# Patient Record
Sex: Female | Born: 1955 | Race: Black or African American | Hispanic: No | Marital: Married | Smoking: Never smoker
Health system: Southern US, Community
[De-identification: ages and names within clinical notes are randomized; demographics above are authoritative.]

## PROBLEM LIST (undated history)

## (undated) DIAGNOSIS — I10 Essential (primary) hypertension: Secondary | ICD-10-CM

## (undated) DIAGNOSIS — I671 Cerebral aneurysm, nonruptured: Secondary | ICD-10-CM

## (undated) HISTORY — DX: Cerebral aneurysm, nonruptured: I67.1

## (undated) HISTORY — PX: ABDOMINAL HYSTERECTOMY: SHX81

---

## 1984-08-01 HISTORY — PX: TUBAL LIGATION: SHX77

## 2002-07-01 HISTORY — PX: CEREBRAL ANEURYSM REPAIR: SHX164

## 2004-07-01 ENCOUNTER — Ambulatory Visit: Payer: Self-pay | Admitting: Internal Medicine

## 2004-07-19 ENCOUNTER — Ambulatory Visit: Payer: Self-pay | Admitting: Internal Medicine

## 2004-12-01 ENCOUNTER — Ambulatory Visit: Payer: Self-pay | Admitting: Internal Medicine

## 2005-08-09 ENCOUNTER — Ambulatory Visit: Payer: Self-pay | Admitting: Obstetrics and Gynecology

## 2006-08-31 ENCOUNTER — Ambulatory Visit: Payer: Self-pay | Admitting: Obstetrics and Gynecology

## 2006-09-08 ENCOUNTER — Ambulatory Visit: Payer: Self-pay | Admitting: Obstetrics and Gynecology

## 2007-01-02 ENCOUNTER — Ambulatory Visit: Payer: Self-pay | Admitting: Nurse Practitioner

## 2007-09-17 ENCOUNTER — Ambulatory Visit: Payer: Self-pay | Admitting: Family Medicine

## 2008-10-06 ENCOUNTER — Ambulatory Visit: Payer: Self-pay | Admitting: Obstetrics and Gynecology

## 2009-12-07 ENCOUNTER — Ambulatory Visit: Payer: Self-pay | Admitting: Obstetrics and Gynecology

## 2010-12-09 ENCOUNTER — Ambulatory Visit: Payer: Self-pay | Admitting: Obstetrics and Gynecology

## 2012-01-04 ENCOUNTER — Ambulatory Visit: Payer: Self-pay | Admitting: Obstetrics and Gynecology

## 2012-05-15 ENCOUNTER — Ambulatory Visit: Payer: Self-pay | Admitting: Gastroenterology

## 2013-01-04 ENCOUNTER — Ambulatory Visit: Payer: Self-pay | Admitting: Obstetrics and Gynecology

## 2014-02-19 ENCOUNTER — Ambulatory Visit: Payer: Self-pay | Admitting: Obstetrics and Gynecology

## 2014-06-19 DIAGNOSIS — I10 Essential (primary) hypertension: Secondary | ICD-10-CM | POA: Insufficient documentation

## 2015-03-24 ENCOUNTER — Other Ambulatory Visit: Payer: Self-pay | Admitting: Family Medicine

## 2015-03-24 DIAGNOSIS — Z1231 Encounter for screening mammogram for malignant neoplasm of breast: Secondary | ICD-10-CM

## 2015-03-31 ENCOUNTER — Ambulatory Visit
Admission: RE | Admit: 2015-03-31 | Discharge: 2015-03-31 | Disposition: A | Discharge status: Home / Self Care | Payer: BLUE CROSS/BLUE SHIELD | Source: Ambulatory Visit | Attending: Family Medicine | Admitting: Family Medicine

## 2015-03-31 DIAGNOSIS — Z1231 Encounter for screening mammogram for malignant neoplasm of breast: Secondary | ICD-10-CM

## 2015-08-17 ENCOUNTER — Other Ambulatory Visit: Payer: Self-pay | Admitting: Family Medicine

## 2015-08-17 DIAGNOSIS — R519 Headache, unspecified: Secondary | ICD-10-CM

## 2015-08-17 DIAGNOSIS — Z9889 Other specified postprocedural states: Secondary | ICD-10-CM

## 2015-08-17 DIAGNOSIS — Z8679 Personal history of other diseases of the circulatory system: Secondary | ICD-10-CM

## 2015-08-17 DIAGNOSIS — R51 Headache: Principal | ICD-10-CM

## 2015-08-21 ENCOUNTER — Ambulatory Visit: Admission: RE | Admit: 2015-08-21 | Payer: BLUE CROSS/BLUE SHIELD | Source: Ambulatory Visit

## 2015-08-24 ENCOUNTER — Other Ambulatory Visit: Payer: Self-pay | Admitting: Family Medicine

## 2015-08-24 DIAGNOSIS — Z8679 Personal history of other diseases of the circulatory system: Secondary | ICD-10-CM

## 2015-08-24 DIAGNOSIS — R51 Headache: Principal | ICD-10-CM

## 2015-08-24 DIAGNOSIS — Z9889 Other specified postprocedural states: Secondary | ICD-10-CM

## 2015-08-24 DIAGNOSIS — R519 Headache, unspecified: Secondary | ICD-10-CM

## 2015-09-01 ENCOUNTER — Ambulatory Visit: Payer: BLUE CROSS/BLUE SHIELD

## 2015-09-03 ENCOUNTER — Ambulatory Visit
Admission: RE | Admit: 2015-09-03 | Discharge: 2015-09-03 | Disposition: A | Discharge status: Home / Self Care | Payer: BLUE CROSS/BLUE SHIELD | Source: Ambulatory Visit | Attending: Family Medicine | Admitting: Family Medicine

## 2015-09-03 DIAGNOSIS — Z8679 Personal history of other diseases of the circulatory system: Secondary | ICD-10-CM

## 2015-09-03 DIAGNOSIS — Z9889 Other specified postprocedural states: Secondary | ICD-10-CM

## 2015-09-03 DIAGNOSIS — R519 Headache, unspecified: Secondary | ICD-10-CM

## 2015-09-03 DIAGNOSIS — R51 Headache: Secondary | ICD-10-CM | POA: Insufficient documentation

## 2015-09-03 HISTORY — DX: Essential (primary) hypertension: I10

## 2015-09-03 LAB — POCT I-STAT CREATININE: CREATININE: 0.9 mg/dL (ref 0.44–1.00)

## 2015-09-03 MED ORDER — IOHEXOL 350 MG/ML SOLN
80.0000 mL | Freq: Once | INTRAVENOUS | Status: AC | PRN
  Administered 2015-09-03: 80 mL via INTRAVENOUS

## 2016-02-11 ENCOUNTER — Ambulatory Visit (INDEPENDENT_AMBULATORY_CARE_PROVIDER_SITE_OTHER): Payer: BLUE CROSS/BLUE SHIELD | Admitting: Surgery

## 2016-02-11 ENCOUNTER — Encounter: Payer: Self-pay | Admitting: Surgery

## 2016-02-11 ENCOUNTER — Other Ambulatory Visit: Payer: Self-pay

## 2016-02-11 VITALS — BP 149/75 | HR 70 | Temp 98.5°F | Ht 67.0 in | Wt 259.0 lb

## 2016-02-11 DIAGNOSIS — L723 Sebaceous cyst: Secondary | ICD-10-CM

## 2016-02-11 DIAGNOSIS — L089 Local infection of the skin and subcutaneous tissue, unspecified: Secondary | ICD-10-CM

## 2016-02-11 MED ORDER — SULFAMETHOXAZOLE-TRIMETHOPRIM 800-160 MG PO TABS: 1.0000 | ORAL_TABLET | Freq: Two times a day (BID) | ORAL | Status: DC

## 2016-02-11 NOTE — Patient Instructions (Signed)
We have sent your antibiotic to your pharmacy. Please see your appointment listed below.

## 2016-02-12 ENCOUNTER — Encounter: Payer: Self-pay | Admitting: Surgery

## 2016-02-12 DIAGNOSIS — L723 Sebaceous cyst: Principal | ICD-10-CM

## 2016-02-12 DIAGNOSIS — L089 Local infection of the skin and subcutaneous tissue, unspecified: Secondary | ICD-10-CM | POA: Insufficient documentation

## 2016-02-12 NOTE — Progress Notes (Addendum)
Subjective:     Patient ID: Colleen Rogers, female   DOB: [birth year], 60 y.o.   MRN: [id number]  HPI  60 year old female who comes in with a complaint of a left shoulder cyst that has been present for 2 years. Patient states that about a week ago it became swollen and painful and red. Patient denies having any drainage from the area. Patient states this has never happened before. Patient denies any fever chills nausea vomiting increased redness. Patient does state that the pain is stabbing and constant in nature and does not move and is a 2 out of 10 pain. Patient saw her primary care physician on Monday and was referred to the surgery clinic. Patient does have a personal history of a brain aneurysm that was inner vascularly resected in 2004 at [facility] and well controlled hypertension but otherwise is healthy. Patient denies having any other lesions like this or any family history of other lesions like this. The patient denies any fever, chills, malaise, chest pain, shortness of breath, abdominal pain, nausea, vomiting, diarrhea, constipation or dysuria.  Past Medical History  Diagnosis Date  . Hypertension   . Cerebral aneurysm without rupture    Past Surgical History  Procedure Laterality Date  . Tubal ligation  1986  . Cerebral aneurysm repair  07/2002   Family History  Problem Relation Age of Onset  . Breast cancer Sister 38  . Diabetes Mother   . Kidney disease Mother   . Lung cancer Brother    Social History   Social History  . Marital Status: Married    Spouse Name: N/A  . Number of Children: N/A  . Years of Education: N/A   Social History Main Topics  . Smoking status: Never Smoker   . Smokeless tobacco: Never Used  . Alcohol Use: No  . Drug Use: No  . Sexual Activity: Not Asked   Other Topics Concern  . None   Social History Narrative    Current outpatient prescriptions:  .  amLODipine (NORVASC) 2.5 MG tablet, Take 2.5 mg by mouth daily., Disp: , Rfl: 1 .  aspirin  EC 81 MG tablet, Take by mouth., Disp: , Rfl:  .  hydrochlorothiazide (HYDRODIURIL) 25 MG tablet, TAKE 1 TABLET (25 MG TOTAL) BY MOUTH ONCE DAILY., Disp: , Rfl: 1 .  Multiple Vitamin (MULTI-VITAMINS) TABS, Take by mouth., Disp: , Rfl:  .  sulfamethoxazole-trimethoprim (BACTRIM DS,SEPTRA DS) 800-160 MG tablet, Take 1 tablet by mouth 2 (two) times daily., Disp: 20 tablet, Rfl: 0 Allergies  Allergen Reactions  . Clindamycin Nausea And Vomiting     Review of Systems  Constitutional: Negative for fever, chills, activity change, appetite change and fatigue.  HENT: Negative for congestion, sinus pressure and sore throat.   Respiratory: Negative for cough, shortness of breath and wheezing.   Cardiovascular: Negative for chest pain, palpitations and leg swelling.  Gastrointestinal: Negative for nausea, vomiting, abdominal pain, diarrhea and constipation.  Genitourinary: Negative for dysuria, hematuria and difficulty urinating.  Musculoskeletal: Negative for back pain and neck pain.  Skin: Positive for wound. Negative for color change, pallor and rash.  Neurological: Negative for dizziness, weakness and headaches.  Hematological: Negative for adenopathy. Does not bruise/bleed easily.  Psychiatric/Behavioral: Negative for agitation. The patient is not nervous/anxious.   All other systems reviewed and are negative.      Filed Vitals:   02/11/16 1441  BP: 149/75  Pulse: 70  Temp: 98.5 F (36.9 C)    Objective:  Physical Exam  Constitutional: She is oriented to person, place, and time. She appears well-developed and well-nourished. No distress.  HENT:  Head: Normocephalic and atraumatic.  Right Ear: External ear normal.  Left Ear: External ear normal.  Nose: Nose normal.  Mouth/Throat: No oropharyngeal exudate.  Eyes: Conjunctivae and EOM are normal. Pupils are equal, round, and reactive to light. No scleral icterus.  Neck: Normal range of motion. Neck supple. No tracheal deviation  present.  Cardiovascular: Normal rate, regular rhythm, normal heart sounds and intact distal pulses.  Exam reveals no gallop and no friction rub.   No murmur heard. Pulmonary/Chest: Effort normal and breath sounds normal. No respiratory distress. She has no wheezes. She has no rales.  Abdominal: Soft. Bowel sounds are normal. She exhibits no distension. There is no tenderness. There is no rebound.  Musculoskeletal: Normal range of motion. She exhibits no edema or tenderness.  Neurological: She is alert and oriented to person, place, and time. No cranial nerve deficit.  Skin: Skin is warm. No rash noted. There is erythema. No pallor.  Left posterior shoulder: 2cm sebaceous cyst with erythema and extreme tenderness, punctum evident but not open, no drainage.   Psychiatric: She has a normal mood and affect. Her behavior is normal. Judgment and thought content normal.  Vitals reviewed.      Assessment:     60 yr old female with infected sebaceous cyst      Plan:     I have personally reviewed her past medical history as noted above for a cerebral aneurysm that was surgically corrected in 2004 and well-controlled high blood pressure. Also reviewed the patient's laboratory values which were all within normal limits. The patient does not have any recent imaging. I have also personally reviewed the notes from her primary care physician noting the cyst on her left shoulder. I discussed with the patient the risks, benefits, alternatives and expected outcomes of incision and drainage of sebaceous cyst. I did explain to the patient that attempting to completely resect the area along with the cyst wall could be attempted however almost 100% of the time the area will come back if attempted resection during the inflamed period. I explained to the patient that the best course of action would be to either try a course of antibiotics and if this resolves the acute inflammation attempt resection in a couple weeks  or to perform an incision and drainage of the area today and in about 3 weeks fully resect the area. The patient would like to attempt to have this fully resected later and see if the antibiotics can resolve the acute infection. I did explain to the patient that occasionally the antibiotics will not be able to penetrate through the area and sometimes will not make a difference and lead to an incision and drainage being necessary. I also explained that it may decrease the inflammation but that the area may begin to drain. I also explained to the patient that a resection would involve risk of bleeding, infection, recurrence of the area and potential damage to structures surrounding the area however on her left shoulder is just some subcutaneous tissues. The patient was given opportunity to ask questions and have them answered. She would like to attempt a trial antibiotics and Bactrim DS was sent to her pharmacy. We will have her return in 3 weeks for resection of this area. The patient was instructed that if the area were to continue to stay red tender and swollen and not  improve or if she were to begin to have a fever and chills are feel that the redness was spreading that she should give [place] a call early next week so we can get her in for incision and drainage at that time.

## 2016-03-02 ENCOUNTER — Ambulatory Visit: Payer: BLUE CROSS/BLUE SHIELD | Admitting: Surgery

## 2016-03-14 ENCOUNTER — Ambulatory Visit (INDEPENDENT_AMBULATORY_CARE_PROVIDER_SITE_OTHER): Payer: BLUE CROSS/BLUE SHIELD | Admitting: Surgery

## 2016-03-14 ENCOUNTER — Ambulatory Visit: Payer: BLUE CROSS/BLUE SHIELD | Admitting: Surgery

## 2016-03-14 ENCOUNTER — Encounter: Payer: Self-pay | Admitting: Surgery

## 2016-03-14 DIAGNOSIS — L723 Sebaceous cyst: Secondary | ICD-10-CM | POA: Diagnosis not present

## 2016-03-14 DIAGNOSIS — L089 Local infection of the skin and subcutaneous tissue, unspecified: Secondary | ICD-10-CM

## 2016-03-14 MED ORDER — SULFAMETHOXAZOLE-TRIMETHOPRIM 800-160 MG PO TABS: 1.0000 | ORAL_TABLET | Freq: Two times a day (BID) | ORAL | 0 refills | Status: DC

## 2016-03-14 NOTE — Progress Notes (Signed)
Sebaceous Cyst Excision Procedure Note  Pre-operative Diagnosis: Epidermal cyst 3cm of left posterior shoulder Post-operative Diagnosis: same  Locations:left posterior shoulder  Indications: 60 year old female with a infection sebaceous cyst on left posterior shoulder she was seen a week earlier and started on Bactrim to decrease the redness and swelling. Patient states that she was slightly better however still having some pain and tenderness in the area.  Anesthesia: Lidocaine 1% without epinephrine without added sodium bicarbonate  Procedure Details  History of allergy to iodine: no  Patient informed of the risks (including bleeding and infection) and benefits of the  procedure and Written informed consent obtained.  The lesion and surrounding area was given a sterile prep using betadyne and draped in the usual sterile fashion. An incision was made over the cyst, which was dissected free of the surrounding tissue and removed.  The cyst was filled with typical sebaceous material.  The wound was closed with 3-0 Nylon using interrupted mattress stitches. Antibiotic ointment and a sterile dressing applied.  The specimen was not sent for pathologic examination. The patient tolerated the procedure well.  EBL: minimal  Findings: Infected cavity with grade necrotic material removed  Condition: Stable  Complications: none.  Plan: 1. Instructed to keep the wound dry and covered for 24-48h and clean thereafter. 2. Warning signs of infection were reviewed.   3. Recommended that the patient use OTC acetaminophen as needed for pain.  4. Return for suture removal in 1 week.

## 2016-03-14 NOTE — Patient Instructions (Signed)
Please do not remove your dressing for the next 48 hours. Then you are able to remove it. Please apply warm compress to your area. Take I buprofen if you have any pain on your left shoulder. We will see you back in a week to make sure everything is doing great and to remove your stitches. Please start taking your antibiotics and finish them all. This prescription was sent to your pharmacy.

## 2016-03-21 ENCOUNTER — Encounter: Payer: Self-pay | Admitting: General Surgery

## 2016-03-21 ENCOUNTER — Ambulatory Visit (INDEPENDENT_AMBULATORY_CARE_PROVIDER_SITE_OTHER): Payer: BLUE CROSS/BLUE SHIELD | Admitting: General Surgery

## 2016-03-21 VITALS — BP 146/91 | HR 76 | Temp 98.4°F | Ht 67.0 in | Wt 265.0 lb

## 2016-03-21 DIAGNOSIS — Z4889 Encounter for other specified surgical aftercare: Secondary | ICD-10-CM

## 2016-03-21 NOTE — Progress Notes (Signed)
Outpatient Surgical Follow Up  03/21/2016  Colleen Rogers is an 60 y.o. female.   Chief Complaint  Patient presents with  . Follow-up    I & D for cyst on left shoulder 03/14/2016 Dr. Samul Dada removal    HPI: 60 year old female returns to clinic 1 week after an I&D of the cyst of the left shoulder. Patient reports doing well. She denies any fevers, chills, nausea, vomiting, chest pain, short of breath, diarrhea, constipation. She's not had any pain or drainage from the surgical site of her left shoulder.  Past Medical History:  Diagnosis Date  . Cerebral aneurysm without rupture   . Hypertension     Past Surgical History:  Procedure Laterality Date  . CEREBRAL ANEURYSM REPAIR  07/2002  . TUBAL LIGATION  1986    Family History  Problem Relation Age of Onset  . Breast cancer Sister 39  . Diabetes Mother   . Kidney disease Mother   . Lung cancer Brother     Social History:  reports that she has never smoked. She has never used smokeless tobacco. She reports that she does not drink alcohol or use drugs.  Allergies:  Allergies  Allergen Reactions  . Clindamycin Nausea And Vomiting    Medications reviewed.    ROS A multipoint review of systems was completed. All pertinent positives and negatives are documented within the history of present illness and remainder are negative.   BP (!) 146/91 (BP Location: Right Arm, Patient Position: Sitting, Cuff Size: Normal)   Pulse 76   Temp 98.4 F (36.9 C) (Oral)   Ht 5\' 7"  (1.702 m)   Wt 120.2 kg (265 lb)   BMI 41.50 kg/m   Physical Exam Gen.: No acute distress Chest: Clear to auscultation Heart: Regular rhythm Abdomen: Soft and nontender Skin: Previous excision site in the left shoulder well approximated with 2 interrupted sutures. No evidence of erythema or drainage.    No results found for this or any previous visit (from the past 48 hour(s)). No results found.  Assessment/Plan:  1. Aftercare  following surgery 60 year old female status post left shoulder I&D. Sutures removed without difficulty. Replaced with Steri-Strips. Discussed signs and symptoms of infection with the patient who voiced understanding. She'll follow-up in clinic on an as-needed basis.     Clayburn Pert, MD FACS General Surgeon  03/21/2016,11:04 AM

## 2016-03-21 NOTE — Patient Instructions (Signed)
Please call our office if you have any questions or concerns.  

## 2016-03-28 ENCOUNTER — Other Ambulatory Visit: Payer: Self-pay | Admitting: Obstetrics and Gynecology

## 2016-03-28 DIAGNOSIS — Z1231 Encounter for screening mammogram for malignant neoplasm of breast: Secondary | ICD-10-CM

## 2016-04-11 ENCOUNTER — Ambulatory Visit
Admission: RE | Admit: 2016-04-11 | Discharge: 2016-04-11 | Disposition: A | Discharge status: Home / Self Care | Payer: BLUE CROSS/BLUE SHIELD | Source: Ambulatory Visit | Attending: Obstetrics and Gynecology | Admitting: Obstetrics and Gynecology

## 2016-04-11 ENCOUNTER — Other Ambulatory Visit: Payer: Self-pay | Admitting: Obstetrics and Gynecology

## 2016-04-11 DIAGNOSIS — Z1231 Encounter for screening mammogram for malignant neoplasm of breast: Secondary | ICD-10-CM | POA: Insufficient documentation

## 2016-07-15 ENCOUNTER — Other Ambulatory Visit: Payer: Self-pay | Admitting: Family Medicine

## 2016-07-15 ENCOUNTER — Ambulatory Visit: Admission: RE | Admit: 2016-07-15 | Payer: BLUE CROSS/BLUE SHIELD | Source: Ambulatory Visit

## 2016-07-15 DIAGNOSIS — M7989 Other specified soft tissue disorders: Secondary | ICD-10-CM

## 2016-07-18 ENCOUNTER — Ambulatory Visit
Admission: RE | Admit: 2016-07-18 | Discharge: 2016-07-18 | Disposition: A | Discharge status: Home / Self Care | Payer: BLUE CROSS/BLUE SHIELD | Source: Ambulatory Visit | Attending: Family Medicine | Admitting: Family Medicine

## 2016-07-18 DIAGNOSIS — I8391 Asymptomatic varicose veins of right lower extremity: Secondary | ICD-10-CM | POA: Diagnosis not present

## 2016-07-18 DIAGNOSIS — M7989 Other specified soft tissue disorders: Secondary | ICD-10-CM | POA: Diagnosis present

## 2016-08-23 ENCOUNTER — Encounter (INDEPENDENT_AMBULATORY_CARE_PROVIDER_SITE_OTHER): Payer: BLUE CROSS/BLUE SHIELD | Admitting: Vascular Surgery

## 2016-08-23 ENCOUNTER — Encounter (INDEPENDENT_AMBULATORY_CARE_PROVIDER_SITE_OTHER): Payer: Self-pay | Admitting: Vascular Surgery

## 2016-08-23 ENCOUNTER — Ambulatory Visit (INDEPENDENT_AMBULATORY_CARE_PROVIDER_SITE_OTHER): Payer: BLUE CROSS/BLUE SHIELD | Admitting: Vascular Surgery

## 2016-08-23 VITALS — BP 144/90 | HR 66 | Resp 16 | Ht 67.0 in | Wt 268.0 lb

## 2016-08-23 DIAGNOSIS — M7989 Other specified soft tissue disorders: Secondary | ICD-10-CM

## 2016-08-23 DIAGNOSIS — Z9889 Other specified postprocedural states: Secondary | ICD-10-CM

## 2016-08-23 DIAGNOSIS — M79604 Pain in right leg: Secondary | ICD-10-CM | POA: Diagnosis not present

## 2016-08-23 DIAGNOSIS — I10 Essential (primary) hypertension: Secondary | ICD-10-CM

## 2016-08-23 DIAGNOSIS — M79609 Pain in unspecified limb: Secondary | ICD-10-CM | POA: Insufficient documentation

## 2016-08-23 DIAGNOSIS — I83811 Varicose veins of right lower extremities with pain: Secondary | ICD-10-CM | POA: Diagnosis not present

## 2016-08-23 NOTE — Assessment & Plan Note (Signed)
blood pressure control important in reducing the progression of atherosclerotic disease. On appropriate oral medications.  

## 2016-08-23 NOTE — Assessment & Plan Note (Signed)
Sounds consistent with venous insufficiency. See workup and plan as below.

## 2016-08-23 NOTE — Assessment & Plan Note (Signed)
Sounds consistent with venous insufficiency. See workup and plan as below

## 2016-08-23 NOTE — Assessment & Plan Note (Signed)
See plan below

## 2016-08-23 NOTE — Progress Notes (Signed)
Patient ID: Colleen Rogers, female   DOB: 1955/08/13, 61 y.o.   MRN: [id number]  Chief Complaint  Patient presents with  . New Evaluation    Right leg pain    HPI Colleen Rogers is a 61 y.o. female.  I am asked to see the patient by Dr. Kandice Robinsons for evaluation of pain, swelling and varicosities of the lower extremities.  The patient presents with complaints of symptomatic varicosities of the legs. The patient reports a long standing history of varicosities and they have become painful over time. 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  The right leg is more severly affected. The patient elevates the legs for relief. The pain is described as aching and heaviness particularly in the lower legs. The symptoms are generally most severe in the evening, particularly when they have been on their feet for long periods of time. Elevation has been used to try to improve the symptoms with limited success. The patient complains of significant swelling as an associated symptom. The swelling affects both legs, but the right is worse. She denies fever, chills, chest pain or shortness of breath.    Past Medical History:  Diagnosis Date  . Cerebral aneurysm without rupture   . Hypertension     Past Surgical History:  Procedure Laterality Date  . CEREBRAL ANEURYSM REPAIR  07/2002  . TUBAL LIGATION  1986    Family History  Problem Relation Age of Onset  . Breast cancer Sister 66  . Diabetes Mother   . Kidney disease Mother   . Lung cancer Brother   No bleeding or clotting disorders  Social History Social History  Substance Use Topics  . Smoking status: Never Smoker  . Smokeless tobacco: Never Used  . Alcohol use No  Married No IVDU  Allergies  Allergen Reactions  . Clindamycin Nausea And Vomiting    Current Outpatient Prescriptions  Medication Sig Dispense  Refill  . amLODipine (NORVASC) 2.5 MG tablet Take 2.5 mg by mouth daily.  1  . aspirin EC 81 MG tablet Take by mouth.    . hydrochlorothiazide (HYDRODIURIL) 25 MG tablet TAKE 1 TABLET (25 MG TOTAL) BY MOUTH ONCE DAILY.  1  . Multiple Vitamin (MULTI-VITAMINS) TABS Take by mouth.    . sulfamethoxazole-trimethoprim (BACTRIM DS,SEPTRA DS) 800-160 MG tablet Take 1 tablet by mouth 2 (two) times daily. (Patient not taking: Reported on 08/23/2016) 20 tablet 0   No current facility-administered medications for this visit.       REVIEW OF SYSTEMS (Negative unless checked)  Constitutional: [] Weight loss  [] Fever  [] Chills Cardiac: [] Chest pain   [] Chest pressure   [] Palpitations   [] Shortness of breath when laying flat   [] Shortness of breath at rest   [] Shortness of breath with exertion. Vascular:  [] Pain in legs with walking   [] Pain in legs at rest   [] Pain in legs when laying flat   [] Claudication   [] Pain in feet when walking  [] Pain in feet at rest  [] Pain in feet when laying flat   [x] History of DVT   [] Phlebitis   [x] Swelling in legs   [x] Varicose veins   [] Non-healing ulcers Pulmonary:   [] Uses home oxygen   [] Productive cough   [] Hemoptysis   [] Wheeze  [] COPD   [] Asthma Neurologic:  [] Dizziness  [] Blackouts   [] Seizures   [x] History of stroke   []   History of TIA  [] Aphasia   [] Temporary blindness   [] Dysphagia   [] Weakness or numbness in arms   [] Weakness or numbness in legs Musculoskeletal:  [] Arthritis   [] Joint swelling   [] Joint pain   [] Low back pain Hematologic:  [] Easy bruising  [] Easy bleeding   [] Hypercoagulable state   [] Anemic  [] Hepatitis Gastrointestinal:  [] Blood in stool   [] Vomiting blood  [] Gastroesophageal reflux/heartburn   [] Abdominal pain Genitourinary:  [] Chronic kidney disease   [] Difficult urination  [] Frequent urination  [] Burning with urination   [] Hematuria Skin:  [] Rashes   [] Ulcers   [] Wounds Psychological:  [] History of anxiety   []  History of major  depression.    Physical Exam BP (!) 144/90 (BP Location: Right Arm)   Pulse 66   Resp 16   Ht 5\' 7"  (1.702 m)   Wt 268 lb (121.6 kg)   BMI 41.97 kg/m  Gen:  WD/WN, NAD Head: Craniotomy scar well healed, No temporalis wasting.  Ear/Nose/Throat: Hearing grossly intact, dentition good Eyes: Sclera non-icteric. Conjunctiva clear Neck: Supple, no nuchal rigidity. Trachea midline Pulmonary:  Good air movement, no use of accessory muscles, respirations not labored.  Cardiac: RRR, No JVD Vascular: Varicosities extensive and measuring up to 5-6 mm in the right lower extremity        Varicosities diffuse and measuring up to 3 mm in the left lower extremity Vessel Right Left  Radial Palpable Palpable  Ulnar Palpable Palpable  Brachial Palpable Palpable  Carotid Palpable, without bruit Palpable, without bruit  Aorta Not palpable N/A  Femoral Palpable Palpable  Popliteal Palpable Palpable  PT Palpable Palpable  DP Palpable Palpable   Gastrointestinal: soft, non-tender/non-distended. No guarding/reflex. No masses, surgical incisions, or scars. Musculoskeletal: M/S 5/5 throughout.   2 + RLE edema.  1 + LLE edema Neurologic: Sensation grossly intact in extremities.  Symmetrical.  Speech is fluent.  Psychiatric: Judgment intact, Mood & affect appropriate for pt's clinical situation. Dermatologic: No rashes or ulcers noted.  No cellulitis or open wounds. Lymph : No Cervical, Axillary, or Inguinal lymphadenopathy.   Radiology No results found.  Labs No results found for this or any previous visit (from the past 2160 hour(s)).  Assessment/Plan:  History of surgery for cerebral aneurysm Status post previous surgery. There is a reported history of DVT following this and some of her symptoms could certainly be postphlebitic in nature, although she does not recall having the DVT. She seems to be doing well without obvious residual deficits that are significant.  BP (high blood  pressure) blood pressure control important in reducing the progression of atherosclerotic disease. On appropriate oral medications.   Pain in limb Sounds consistent with venous insufficiency. See workup and plan as below.  Swelling of limb Sounds consistent with venous insufficiency. See workup and plan as below  Varicose veins of right lower extremity with pain See plan below    The patient has symptoms consistent with chronic venous insufficiency. We discussed the natural history and treatment options for venous disease. I recommended the regular use of 20 - 30 mm Hg compression stockings, and prescribed these today. I recommended leg elevation and anti-inflammatories as needed for pain. I have also recommended a complete venous duplex to assess the venous system for reflux or thrombotic issues. This can be done at the patient's convenience. I will see the patient back in 3 months to assess the response to conservative management, and determine further treatment options.     Leotis Pain 08/23/2016, 10:58 AM  This note was created with Dragon medical transcription system.  Any errors from dictation are unintentional.

## 2016-08-23 NOTE — Assessment & Plan Note (Signed)
Status post previous surgery. There is a reported history of DVT following this and some of her symptoms could certainly be postphlebitic in nature, although she does not recall having the DVT. She seems to be doing well without obvious residual deficits that are significant.

## 2016-08-23 NOTE — Patient Instructions (Signed)
Varicose Veins Varicose veins are veins that have become enlarged and twisted. They are usually seen in the legs but can occur in other parts of the body as well. CAUSES This condition is the result of valves in the veins not working properly. Valves in the veins help to return blood from the leg to the heart. If these valves are damaged, blood flows backward and backs up into the veins in the leg near the skin. This causes the veins to become larger. RISK FACTORS People who are on their feet a lot, who are pregnant, or who are overweight are more likely to develop varicose veins. SIGNS AND SYMPTOMS  Bulging, twisted-appearing, bluish veins, most commonly found on the legs.  Leg pain or a feeling of heaviness. These symptoms may be worse at the end of the day.  Leg swelling.  Changes in skin color. DIAGNOSIS A health care provider can usually diagnose varicose veins by examining your legs. Your health care provider may also recommend an ultrasound of your leg veins. TREATMENT Most varicose veins can be treated at home.However, other treatments are available for people who have persistent symptoms or want to improve the cosmetic appearance of the varicose veins. These treatment options include:  Sclerotherapy. A solution is injected into the vein to close it off.  Laser treatment. A laser is used to heat the vein to close it off.  Radiofrequency vein ablation. An electrical current produced by radio waves is used to close off the vein.  Phlebectomy. The vein is surgically removed through small incisions made over the varicose vein.  Vein ligation and stripping. The vein is surgically removed through incisions made over the varicose vein after the vein has been tied (ligated). HOME CARE INSTRUCTIONS   Do not stand or sit in one position for long periods of time. Do not sit with your legs crossed. Rest with your legs raised during the day.  Wear compression stockings as directed by  your health care provider. These stockings help to prevent blood clots and reduce swelling in your legs.  Do not wear other tight, encircling garments around your legs, pelvis, or waist.  Walk as much as possible to increase blood flow.  Raise the foot of your bed at night with 2-inch blocks.  If you get a cut in the skin over the vein and the vein bleeds, lie down with your leg raised and press on it with a clean cloth until the bleeding stops. Then place a bandage (dressing) on the cut. See your health care provider if it continues to bleed. SEEK MEDICAL CARE IF:  The skin around your ankle starts to break down.  You have pain, redness, tenderness, or hard swelling in your leg over a vein.  You are uncomfortable because of leg pain. This information is not intended to replace advice given to you by your health care provider. Make sure you discuss any questions you have with your health care provider. Document Released: 04/27/2005 Document Revised: 11/09/2015 Document Reviewed: 12/03/2013 [employer] Interactive Patient Education  2017 [employer]  

## 2016-11-25 ENCOUNTER — Encounter (INDEPENDENT_AMBULATORY_CARE_PROVIDER_SITE_OTHER): Payer: BLUE CROSS/BLUE SHIELD

## 2016-11-25 ENCOUNTER — Ambulatory Visit (INDEPENDENT_AMBULATORY_CARE_PROVIDER_SITE_OTHER): Payer: BLUE CROSS/BLUE SHIELD | Admitting: Vascular Surgery

## 2017-05-03 ENCOUNTER — Other Ambulatory Visit: Payer: Self-pay | Admitting: Obstetrics and Gynecology

## 2017-05-03 DIAGNOSIS — Z1231 Encounter for screening mammogram for malignant neoplasm of breast: Secondary | ICD-10-CM

## 2017-05-29 ENCOUNTER — Ambulatory Visit
Admission: RE | Admit: 2017-05-29 | Discharge: 2017-05-29 | Disposition: A | Discharge status: Home / Self Care | Payer: BLUE CROSS/BLUE SHIELD | Source: Ambulatory Visit | Attending: Obstetrics and Gynecology | Admitting: Obstetrics and Gynecology

## 2017-05-29 DIAGNOSIS — Z1231 Encounter for screening mammogram for malignant neoplasm of breast: Secondary | ICD-10-CM | POA: Diagnosis present

## 2018-06-20 ENCOUNTER — Other Ambulatory Visit: Payer: Self-pay | Admitting: Obstetrics and Gynecology

## 2018-06-20 DIAGNOSIS — Z1231 Encounter for screening mammogram for malignant neoplasm of breast: Secondary | ICD-10-CM

## 2018-07-04 ENCOUNTER — Ambulatory Visit
Admission: RE | Admit: 2018-07-04 | Discharge: 2018-07-04 | Disposition: A | Discharge status: Home / Self Care | Payer: Managed Care, Other (non HMO) | Source: Ambulatory Visit | Attending: Obstetrics and Gynecology | Admitting: Obstetrics and Gynecology

## 2018-07-04 ENCOUNTER — Encounter (INDEPENDENT_AMBULATORY_CARE_PROVIDER_SITE_OTHER): Payer: Self-pay

## 2018-07-04 DIAGNOSIS — Z1231 Encounter for screening mammogram for malignant neoplasm of breast: Secondary | ICD-10-CM | POA: Insufficient documentation

## 2018-10-15 ENCOUNTER — Encounter (INDEPENDENT_AMBULATORY_CARE_PROVIDER_SITE_OTHER): Payer: Self-pay | Admitting: Nurse Practitioner

## 2018-10-15 ENCOUNTER — Ambulatory Visit (INDEPENDENT_AMBULATORY_CARE_PROVIDER_SITE_OTHER): Payer: Managed Care, Other (non HMO) | Admitting: Nurse Practitioner

## 2018-10-15 ENCOUNTER — Other Ambulatory Visit: Payer: Self-pay

## 2018-10-15 VITALS — BP 147/88 | HR 66 | Resp 19 | Ht 67.0 in | Wt 248.0 lb

## 2018-10-15 DIAGNOSIS — I10 Essential (primary) hypertension: Secondary | ICD-10-CM

## 2018-10-15 DIAGNOSIS — R600 Localized edema: Secondary | ICD-10-CM | POA: Diagnosis not present

## 2018-10-15 DIAGNOSIS — Z79899 Other long term (current) drug therapy: Secondary | ICD-10-CM | POA: Diagnosis not present

## 2018-10-15 DIAGNOSIS — I83813 Varicose veins of bilateral lower extremities with pain: Secondary | ICD-10-CM

## 2018-10-15 DIAGNOSIS — M7989 Other specified soft tissue disorders: Secondary | ICD-10-CM

## 2018-10-15 NOTE — Progress Notes (Signed)
SUBJECTIVE:  Patient ID: Colleen Rogers, female    DOB: July 29, 1956, 63 y.o.   MRN: [id number] Chief Complaint  Patient presents with  . Follow-up    varicose veins    HPI  Colleen Rogers is a 63 y.o. female The patient returns for followup evaluation 2 years after the initial visit. The patient continues to have pain in the lower extremities with dependency.  She also has swelling in the bilateral lower extremities.  The pain and swelling is lessened with elevation.  Since her last visit she has been wearing graduated compression stockings, Class I (20-30 mmHg), have been worn but the stockings do not eliminate the leg pain. Over-the-counter analgesics do not improve the symptoms. The degree of discomfort continues to interfere with daily activities. The patient notes the pain in the legs is causing problems with daily exercise, at the workplace and even with household activities and maintenance such as standing in the kitchen preparing meals and doing dishes.    Past Medical History:  Diagnosis Date  . Cerebral aneurysm without rupture   . Hypertension     Past Surgical History:  Procedure Laterality Date  . CEREBRAL ANEURYSM REPAIR  07/2002  . TUBAL LIGATION  1986    Social History   Socioeconomic History  . Marital status: Married    Spouse name: Not on file  . Number of children: Not on file  . Years of education: Not on file  . Highest education level: Not on file  Occupational History  . Not on file  Social Needs  . Financial resource strain: Not on file  . Food insecurity:    Worry: Not on file    Inability: Not on file  . Transportation needs:    Medical: Not on file    Non-medical: Not on file  Tobacco Use  . Smoking status: Never Smoker  . Smokeless tobacco: Never Used  Substance and Sexual Activity  . Alcohol use: No  . Drug use: No  . Sexual activity: Not on file  Lifestyle  . Physical activity:    Days per week: Not on file    Minutes per  session: Not on file  . Stress: Not on file  Relationships  . Social connections:    Talks on phone: Not on file    Gets together: Not on file    Attends religious service: Not on file    Active member of club or organization: Not on file    Attends meetings of clubs or organizations: Not on file    Relationship status: Not on file  . Intimate partner violence:    Fear of current or ex partner: Not on file    Emotionally abused: Not on file    Physically abused: Not on file    Forced sexual activity: Not on file  Other Topics Concern  . Not on file  Social History Narrative  . Not on file    Family History  Problem Relation Age of Onset  . Breast cancer Sister 72  . Diabetes Mother   . Kidney disease Mother   . Lung cancer Brother     Allergies  Allergen Reactions  . Clindamycin Nausea And Vomiting     Review of Systems   Review of Systems: Negative Unless Checked Constitutional: [] Weight loss  [] Fever  [] Chills Cardiac: [] Chest pain   []  Atrial Fibrillation  [] Palpitations   [] Shortness of breath when laying flat   [] Shortness of breath with exertion. []   Shortness of breath at rest Vascular:  [] Pain in legs with walking   [x] Pain in legs with standing [] Pain in legs when laying flat   [] Claudication    [] Pain in feet when laying flat    [] History of DVT   [] Phlebitis   [x] Swelling in legs   [x] Varicose veins   [] Non-healing ulcers Pulmonary:   [] Uses home oxygen   [] Productive cough   [] Hemoptysis   [] Wheeze  [] COPD   [] Asthma Neurologic:  [] Dizziness   [] Seizures  [] Blackouts [] History of stroke   [] History of TIA  [] Aphasia   [] Temporary Blindness   [] Weakness or numbness in arm   [] Weakness or numbness in leg Musculoskeletal:   [] Joint swelling   [] Joint pain   [] Low back pain  []  History of Knee Replacement [] Arthritis [] back Surgeries  []  Spinal Stenosis    Hematologic:  [] Easy bruising  [] Easy bleeding   [] Hypercoagulable state   [] Anemic Gastrointestinal:   [] Diarrhea   [] Vomiting  [] Gastroesophageal reflux/heartburn   [] Difficulty swallowing. [] Abdominal pain Genitourinary:  [] Chronic kidney disease   [] Difficult urination  [] Anuric   [] Blood in urine [] Frequent urination  [] Burning with urination   [] Hematuria Skin:  [] Rashes   [] Ulcers [] Wounds Psychological:  [] History of anxiety   []  History of major depression  []  Memory Difficulties      OBJECTIVE:   Physical Exam  BP (!) 147/88 (BP Location: Right Arm)   Pulse 66   Resp 19   Ht 5\' 7"  (1.702 m)   Wt 248 lb (112.5 kg)   BMI 38.84 kg/m   Gen: WD/WN, NAD Head: [place]/AT, No temporalis wasting.  Ear/Nose/Throat: Hearing grossly intact, nares w/o erythema or drainage Eyes: PER, EOMI, sclera nonicteric.  Neck: Supple, no masses.  No JVD.  Pulmonary:  Good air movement, no use of accessory muscles.  Cardiac: RRR Vascular:  Scattered spider varicosities bilaterally.  Right lower extremity has large, ropelike varicosities from mid thigh to distal portion of calf.  Approximately 3 mm in diameter. Vessel Right Left  Radial Palpable Palpable  Dorsalis Pedis Palpable Palpable  Posterior Tibial Palpable Palpable   Gastrointestinal: soft, non-distended. No guarding/no peritoneal signs.  Musculoskeletal: M/S 5/5 throughout.  No deformity or atrophy.  Neurologic: Pain and light touch intact in extremities.  Symmetrical.  Speech is fluent. Motor exam as listed above. Psychiatric: Judgment intact, Mood & affect appropriate for pt's clinical situation. Dermatologic: No Venous rashes. No Ulcers Noted.  No changes consistent with cellulitis. Lymph : No Cervical lymphadenopathy, no lichenification or skin changes of chronic lymphedema.       ASSESSMENT AND PLAN:  1. Varicose veins of both lower extremities with pain  Recommend:  The patient has large symptomatic varicose veins that are painful and associated with swelling.  I have had a long discussion with the patient regarding  varicose  veins and why they cause symptoms.  Patient will continue graduated compression stockings class 1 on a daily basis, beginning first thing in the morning and removing them in the evening. The patient is instructed specifically not to sleep in the stockings.    The patient  will also begin using over-the-counter analgesics such as Motrin 600 mg po TID to help control the symptoms.    In addition, behavioral modification including elevation during the day will be continued      An  ultrasound of the venous system will be obtained.   Further plans will be based on the ultrasound results  - VAS [place] LOWER EXTREMITY  VENOUS REFLUX; Future  2. Essential hypertension Continue antihypertensive medications as already ordered, these medications have been reviewed and there are no changes at this time.   3. Swelling of limb See above    Current Outpatient Medications on File Prior to Visit  Medication Sig Dispense Refill  . amLODipine (NORVASC) 2.5 MG tablet Take 2.5 mg by mouth daily.  1  . aspirin EC 81 MG tablet Take by mouth.    . hydrochlorothiazide (HYDRODIURIL) 25 MG tablet TAKE 1 TABLET (25 MG TOTAL) BY MOUTH ONCE DAILY.  1  . Multiple Vitamin (MULTI-VITAMINS) TABS Take by mouth.    . sulfamethoxazole-trimethoprim (BACTRIM DS,SEPTRA DS) 800-160 MG tablet Take 1 tablet by mouth 2 (two) times daily. (Patient not taking: Reported on 08/23/2016) 20 tablet 0   No current facility-administered medications on file prior to visit.     There are no Patient Instructions on file for this visit. No follow-ups on file.   Kris Hartmann, NP  This note was completed with Sales executive.  Any errors are purely unintentional.

## 2018-10-16 ENCOUNTER — Encounter (INDEPENDENT_AMBULATORY_CARE_PROVIDER_SITE_OTHER): Payer: Self-pay | Admitting: Vascular Surgery

## 2018-10-16 ENCOUNTER — Ambulatory Visit (INDEPENDENT_AMBULATORY_CARE_PROVIDER_SITE_OTHER): Payer: Managed Care, Other (non HMO) | Admitting: Vascular Surgery

## 2018-10-16 ENCOUNTER — Ambulatory Visit (INDEPENDENT_AMBULATORY_CARE_PROVIDER_SITE_OTHER): Payer: Managed Care, Other (non HMO)

## 2018-10-16 VITALS — BP 138/85 | HR 56 | Resp 16 | Ht 67.0 in | Wt 248.0 lb

## 2018-10-16 DIAGNOSIS — I10 Essential (primary) hypertension: Secondary | ICD-10-CM

## 2018-10-16 DIAGNOSIS — Z9889 Other specified postprocedural states: Secondary | ICD-10-CM

## 2018-10-16 DIAGNOSIS — I83811 Varicose veins of right lower extremities with pain: Secondary | ICD-10-CM

## 2018-10-16 DIAGNOSIS — Z79899 Other long term (current) drug therapy: Secondary | ICD-10-CM | POA: Diagnosis not present

## 2018-10-16 DIAGNOSIS — I83813 Varicose veins of bilateral lower extremities with pain: Secondary | ICD-10-CM | POA: Diagnosis not present

## 2018-10-16 NOTE — Patient Instructions (Signed)
Nonsurgical Procedures for Varicose Veins, Care After  This sheet gives you information about how to care for yourself after your procedure. Your health care provider may also give you more specific instructions. If you have problems or questions, contact your health care provider.  What can I expect after the procedure?  After the procedure, it is common to have:  · Swelling.  · Bruising.  · Soreness.  · Mild skin discoloration.  · Slight bleeding at the incision sites.  Follow these instructions at home:  Incision or puncture site care  · Follow instructions from your health care provider about how to take care of your incision or puncture site. Make sure you:  ? Wash your hands with soap and water before you change your bandage (dressing). If soap and water are not available, use hand sanitizer.  ? Change your dressing as told by your health care provider.  ? Leave skin glue or adhesive strips in place. These skin closures may need to stay in place for 2 weeks or longer. If adhesive strip edges start to loosen and curl up, you may trim the loose edges. Do not remove adhesive strips completely unless your health care provider tells you to do that.  · Check your incision or puncture area every day for signs of infection. Check for:  ? Redness, swelling, or pain.  ? Fluid or blood.  ? Warmth.  ? Pus or a bad smell.  General instructions    · Take over-the-counter and prescription medicines only as told by your health care provider.  · Wear compression stockings as told by your health care provider. These stockings help to prevent blood clots and reduce swelling in your legs.  · Do not take baths, swim, or use a hot tub until your health care provider approves. Ask your health care provider if you can take showers.  · Wear loose-fitting clothing.  · Return to your normal activities as told by your health care provider. Ask your health care provider what activities are safe for you.  · Get regular daily exercise. Walk  or ride a stationary bike daily or as told by your health care provider.  · Keep all follow-up visits as told by your health care provider. This is important.  Contact a health care provider if:  · You have a fever.  · You have redness, swelling, or pain around your incision or puncture site.  · You have fluid or blood coming from your incision or puncture site.  · Your incision or puncture site feels warm to the touch.  · You have pus or a bad smell coming from your incision or puncture site.  · You develop a cough.  Get help right away if:  · You pass out.  · You have very bad pain in your leg.  · You have leg pain that gets worse when you walk.  · You have redness or swelling in your leg that is getting worse.  · You have trouble breathing.  · You cough up blood.  Summary  · After the procedure, it is common to have swelling, bruising, soreness, or mild skin discoloration.  · Follow instructions from your health care provider about how to take care of your incision or puncture site.  · Wear compression stockings as told by your health care provider. These stockings help to prevent blood clots and reduce swelling in your legs.  This information is not intended to replace advice given to you by 

## 2018-10-16 NOTE — Assessment & Plan Note (Signed)
Venous reflux study today demonstrates deep venous reflux bilaterally with significant right great saphenous vein reflux and left small saphenous vein reflux.  No DVT or superficial thrombophlebitis was seen. Conservative therapy have not adequately controlled her symptoms.  As such, she would benefit from right great saphenous vein laser ablation.  Risks and benefits were discussed with the patient.  The patient had the procedure described to them in detail.  She voices her understanding and is agreeable to proceed.  We also discussed foam sclerotherapy for the large prominent residual varicosities that would likely be left after laser ablation.

## 2018-10-16 NOTE — Progress Notes (Signed)
MRN : [id number]  Colleen Rogers is a 63 y.o. ([birth year]) female who presents with chief complaint of  Chief Complaint  Patient presents with  . Follow-up    ultrasound follow up  .  History of Present Illness: Patient returns today in follow up of her right leg pain and swelling.  Compression stockings and elevation had minimal improvement in terms of the pain and swelling in that right leg recently.  Her varicosities continue to get more prominent.  The left leg has some symptoms but not as severe as the right.  Venous reflux study today demonstrates deep venous reflux bilaterally with significant right great saphenous vein reflux and left small saphenous vein reflux.  No DVT or superficial thrombophlebitis was seen.  Current Outpatient Medications  Medication Sig Dispense Refill  . amLODipine (NORVASC) 2.5 MG tablet Take 2.5 mg by mouth daily.  1  . aspirin EC 81 MG tablet Take by mouth.    . hydrochlorothiazide (HYDRODIURIL) 25 MG tablet TAKE 1 TABLET (25 MG TOTAL) BY MOUTH ONCE DAILY.  1  . Multiple Vitamin (MULTI-VITAMINS) TABS Take by mouth.    . sulfamethoxazole-trimethoprim (BACTRIM DS,SEPTRA DS) 800-160 MG tablet Take 1 tablet by mouth 2 (two) times daily. (Patient not taking: Reported on 08/23/2016) 20 tablet 0   No current facility-administered medications for this visit.     Past Medical History:  Diagnosis Date  . Cerebral aneurysm without rupture   . Hypertension     Past Surgical History:  Procedure Laterality Date  . CEREBRAL ANEURYSM REPAIR  07/2002  . TUBAL LIGATION  1986    Social History Social History   Tobacco Use  . Smoking status: Never Smoker  . Smokeless tobacco: Never Used  Substance Use Topics  . Alcohol use: No  . Drug use: No     Family History Family History  Problem Relation Age of Onset  . Breast cancer Sister 28  . Diabetes Mother   . Kidney disease Mother   . Lung cancer Brother     Allergies  Allergen Reactions  .  Clindamycin Nausea And Vomiting    REVIEW OF SYSTEMS (Negative unless checked)  Constitutional: [] ?Weight loss  [] ?Fever  [] ?Chills Cardiac: [] ?Chest pain   [] ?Chest pressure   [] ?Palpitations   [] ?Shortness of breath when laying flat   [] ?Shortness of breath at rest   [] ?Shortness of breath with exertion. Vascular:  [] ?Pain in legs with walking   [] ?Pain in legs at rest   [] ?Pain in legs when laying flat   [] ?Claudication   [] ?Pain in feet when walking  [] ?Pain in feet at rest  [] ?Pain in feet when laying flat   [x] ?History of DVT   [] ?Phlebitis   [x] ?Swelling in legs   [x] ?Varicose veins   [] ?Non-healing ulcers Pulmonary:   [] ?Uses home oxygen   [] ?Productive cough   [] ?Hemoptysis   [] ?Wheeze  [] ?COPD   [] ?Asthma Neurologic:  [] ?Dizziness  [] ?Blackouts   [] ?Seizures   [x] ?History of stroke   [] ?History of TIA  [] ?Aphasia   [] ?Temporary blindness   [] ?Dysphagia   [] ?Weakness or numbness in arms   [] ?Weakness or numbness in legs Musculoskeletal:  [] ?Arthritis   [] ?Joint swelling   [] ?Joint pain   [] ?Low back pain Hematologic:  [] ?Easy bruising  [] ?Easy bleeding   [] ?Hypercoagulable state   [] ?Anemic  [] ?Hepatitis Gastrointestinal:  [] ?Blood in stool   [] ?Vomiting blood  [] ?Gastroesophageal reflux/heartburn   [] ?Abdominal pain Genitourinary:  [] ?Chronic kidney disease   [] ?Difficult urination  [] ?Frequent  urination  [] ?Burning with urination   [] ?Hematuria Skin:  [] ?Rashes   [] ?Ulcers   [] ?Wounds Psychological:  [] ?History of anxiety   [] ? History of major depression.    Physical Examination  BP 138/85 (BP Location: Right Arm)   Pulse (!) 56   Resp 16   Ht 5\' 7"  (1.702 m)   Wt 248 lb (112.5 kg)   BMI 38.84 kg/m  Gen:  WD/WN, NAD Head: /AT, No temporalis wasting. Ear/Nose/Throat: Hearing grossly intact, nares w/o erythema or drainage Eyes: Conjunctiva clear. Sclera non-icteric Neck: Supple.  Trachea midline Pulmonary:  Good air movement, no use of accessory muscles.  Cardiac:  RRR, no JVD Vascular: 3 to 4 mm varicosities present throughout the right lower leg.  Smaller varicosities present in the left lower leg. Vessel Right Left  Radial Palpable Palpable                          PT Palpable Palpable  DP Palpable Palpable    Musculoskeletal: M/S 5/5 throughout.  No deformity or atrophy.  1+ right lower extremity edema, trace left lower extremity edema. Neurologic: Sensation grossly intact in extremities.  Symmetrical.  Speech is fluent.  Psychiatric: Judgment intact, Mood & affect appropriate for pt's clinical situation. Dermatologic: No rashes or ulcers noted.  No cellulitis or open wounds.       Labs No results found for this or any previous visit (from the past 2160 hour(s)).  Radiology No results found.  Assessment/Plan History of surgery for cerebral aneurysm Status post previous surgery. There is a reported history of DVT following this and some of her symptoms could certainly be postphlebitic in nature, although she does not recall having the DVT. She seems to be doing well without obvious residual deficits that are significant.  BP (high blood pressure) blood pressure control important in reducing the progression of atherosclerotic disease. On appropriate oral medications.  Varicose veins of right lower extremity with pain Venous reflux study today demonstrates deep venous reflux bilaterally with significant right great saphenous vein reflux and left small saphenous vein reflux.  No DVT or superficial thrombophlebitis was seen. Conservative therapy have not adequately controlled her symptoms.  As such, she would benefit from right great saphenous vein laser ablation.  Risks and benefits were discussed with the patient.  The patient had the procedure described to them in detail.  She voices her understanding and is agreeable to proceed.  We also discussed foam sclerotherapy for the large prominent residual varicosities that would likely be  left after laser ablation.    Leotis Pain, MD  10/16/2018 12:59 PM    This note was created with Dragon medical transcription system.  Any errors from dictation are purely unintentional

## 2019-04-05 ENCOUNTER — Other Ambulatory Visit: Payer: Self-pay

## 2019-04-05 ENCOUNTER — Encounter (INDEPENDENT_AMBULATORY_CARE_PROVIDER_SITE_OTHER): Payer: Self-pay

## 2019-04-05 ENCOUNTER — Encounter (INDEPENDENT_AMBULATORY_CARE_PROVIDER_SITE_OTHER): Payer: Self-pay | Admitting: Vascular Surgery

## 2019-04-05 ENCOUNTER — Other Ambulatory Visit (INDEPENDENT_AMBULATORY_CARE_PROVIDER_SITE_OTHER): Payer: Self-pay | Admitting: Vascular Surgery

## 2019-04-05 ENCOUNTER — Ambulatory Visit (INDEPENDENT_AMBULATORY_CARE_PROVIDER_SITE_OTHER): Payer: 59 | Admitting: Vascular Surgery

## 2019-04-05 VITALS — BP 140/77 | HR 77 | Resp 16 | Ht 67.0 in | Wt 259.0 lb

## 2019-04-05 DIAGNOSIS — Z9889 Other specified postprocedural states: Secondary | ICD-10-CM

## 2019-04-05 DIAGNOSIS — I83811 Varicose veins of right lower extremities with pain: Secondary | ICD-10-CM

## 2019-04-05 NOTE — Progress Notes (Signed)
  Colleen Rogers is a 63 y.o. female who presents with symptomatic venous reflux  Past Medical History:  Diagnosis Date  . Cerebral aneurysm without rupture   . Hypertension     Past Surgical History:  Procedure Laterality Date  . CEREBRAL ANEURYSM REPAIR  07/2002  . TUBAL LIGATION  1986     Current Outpatient Medications:  .  amLODipine (NORVASC) 2.5 MG tablet, Take 2.5 mg by mouth daily., Disp: , Rfl: 1 .  aspirin EC 81 MG tablet, Take by mouth., Disp: , Rfl:  .  hydrochlorothiazide (HYDRODIURIL) 25 MG tablet, TAKE 1 TABLET (25 MG TOTAL) BY MOUTH ONCE DAILY., Disp: , Rfl: 1 .  Multiple Vitamin (MULTI-VITAMINS) TABS, Take by mouth., Disp: , Rfl:  .  sulfamethoxazole-trimethoprim (BACTRIM DS,SEPTRA DS) 800-160 MG tablet, Take 1 tablet by mouth 2 (two) times daily. (Patient not taking: Reported on 08/23/2016), Disp: 20 tablet, Rfl: 0  Allergies  Allergen Reactions  . Clindamycin Nausea And Vomiting     Varicose veins of right lower extremity with pain     PLAN: The patient's right lower extremity was sterilely prepped and draped. The ultrasound machine was used to visualize the saphenous vein throughout its course. A segment of the GSV around the level of the knee was selected for access. The saphenous vein was accessed without difficulty using ultrasound guidance with a micropuncture needle. A 0.018 wire was then placed beyond the saphenofemoral junction and the needle was removed. The 65 cm sheath was then placed over the wire and the wire and dilator were removed. The laser fiber was then placed through the sheath and its tip was placed approximately 4-5 centimeters below the saphenofemoral junction. Tumescent anesthesia was then created with a dilute lidocaine solution. Laser energy was then delivered with constant withdrawal of the sheath and laser fiber. Approximately 1250 joules of energy were delivered over a length of 33 centimeters using a 1470 Hz VenaCure machine at 7 W.  Sterile dressings were placed. The patient tolerated the procedure well without obvious complications.   Follow-up in 1 week with post-laser duplex.

## 2019-04-09 ENCOUNTER — Other Ambulatory Visit: Payer: Self-pay

## 2019-04-09 ENCOUNTER — Ambulatory Visit (INDEPENDENT_AMBULATORY_CARE_PROVIDER_SITE_OTHER): Payer: 59

## 2019-04-09 DIAGNOSIS — Z9889 Other specified postprocedural states: Secondary | ICD-10-CM

## 2019-05-03 ENCOUNTER — Other Ambulatory Visit: Payer: Self-pay

## 2019-05-03 ENCOUNTER — Encounter (INDEPENDENT_AMBULATORY_CARE_PROVIDER_SITE_OTHER): Payer: Self-pay | Admitting: Nurse Practitioner

## 2019-05-03 ENCOUNTER — Ambulatory Visit (INDEPENDENT_AMBULATORY_CARE_PROVIDER_SITE_OTHER): Payer: 59 | Admitting: Nurse Practitioner

## 2019-05-03 VITALS — BP 134/85 | HR 71 | Resp 19 | Ht 67.0 in | Wt 257.0 lb

## 2019-05-03 DIAGNOSIS — M7989 Other specified soft tissue disorders: Secondary | ICD-10-CM

## 2019-05-03 DIAGNOSIS — I83811 Varicose veins of right lower extremities with pain: Secondary | ICD-10-CM | POA: Diagnosis not present

## 2019-05-03 DIAGNOSIS — I10 Essential (primary) hypertension: Secondary | ICD-10-CM | POA: Diagnosis not present

## 2019-05-06 ENCOUNTER — Encounter (INDEPENDENT_AMBULATORY_CARE_PROVIDER_SITE_OTHER): Payer: Self-pay | Admitting: Nurse Practitioner

## 2019-05-06 NOTE — Progress Notes (Addendum)
SUBJECTIVE:  Patient ID: Colleen Rogers, female    DOB: [birth year], 63 y.o.   MRN: [id number] Chief Complaint  Patient presents with  . Follow-up    HPI  Colleen Rogers is a 63 y.o. female The patient returns to the office for followup status post laser ablation of the right great saphenous vein on 04/05/2019. The patient notes multiple residual varicosities bilaterally which continued to hurt with dependent positions and remained tender to palpation. The patient's swelling is unchanged from preoperative status. The patient continues to wear graduated compression stockings on a daily basis but these are not eliminating the pain and discomfort. The patient continues to use over-the-counter anti-inflammatory medications to treat the pain and related symptoms but this has not given the patient relief. The patient notes the pain in the lower extremities is causing problems with daily exercise, problems at work and even with household activities such as preparing meals and doing dishes.  The patient is otherwise done well and there have been no complications related to the laser procedure or interval changes in the patient's overall   Venous ultrasound post laser shows successful laser ablation of the right great saphenous vein, no DVT identified. Venous reflux study measures the patient's varicosities in the great saphenous vein to be greater than 5 mm throughout.    Past Medical History:  Diagnosis Date  . Cerebral aneurysm without rupture   . Hypertension     Past Surgical History:  Procedure Laterality Date  . CEREBRAL ANEURYSM REPAIR  07/2002  . TUBAL LIGATION  1986    Social History   Socioeconomic History  . Marital status: Married    Spouse name: Not on file  . Number of children: Not on file  . Years of education: Not on file  . Highest education level: Not on file  Occupational History  . Not on file  Social Needs  . Financial resource strain: Not on file  .  Food insecurity    Worry: Not on file    Inability: Not on file  . Transportation needs    Medical: Not on file    Non-medical: Not on file  Tobacco Use  . Smoking status: Never Smoker  . Smokeless tobacco: Never Used  Substance and Sexual Activity  . Alcohol use: No  . Drug use: No  . Sexual activity: Not on file  Lifestyle  . Physical activity    Days per week: Not on file    Minutes per session: Not on file  . Stress: Not on file  Relationships  . Social Herbalist on phone: Not on file    Gets together: Not on file    Attends religious service: Not on file    Active member of club or organization: Not on file    Attends meetings of clubs or organizations: Not on file    Relationship status: Not on file  . Intimate partner violence    Fear of current or ex partner: Not on file    Emotionally abused: Not on file    Physically abused: Not on file    Forced sexual activity: Not on file  Other Topics Concern  . Not on file  Social History Narrative  . Not on file    Family History  Problem Relation Age of Onset  . Breast cancer Sister 35  . Diabetes Mother   . Kidney disease Mother   . Lung cancer Brother     Allergies  Allergen Reactions  . Clindamycin Nausea And Vomiting     Review of Systems   Review of Systems: Negative Unless Checked Constitutional: [] Weight loss  [] Fever  [] Chills Cardiac: [] Chest pain   []  Atrial Fibrillation  [] Palpitations   [] Shortness of breath when laying flat   [] Shortness of breath with exertion. [] Shortness of breath at rest Vascular:  [] Pain in legs with walking   [] Pain in legs with standing [] Pain in legs when laying flat   [] Claudication    [] Pain in feet when laying flat    [] History of DVT   [] Phlebitis   [x] Swelling in legs   [x] Varicose veins   [] Non-healing ulcers Pulmonary:   [] Uses home oxygen   [] Productive cough   [] Hemoptysis   [] Wheeze  [] COPD   [] Asthma Neurologic:  [] Dizziness   [] Seizures  [] Blackouts  [] History of stroke   [] History of TIA  [] Aphasia   [] Temporary Blindness   [] Weakness or numbness in arm   [] Weakness or numbness in leg Musculoskeletal:   [] Joint swelling   [] Joint pain   [] Low back pain  []  History of Knee Replacement [] Arthritis [] back Surgeries  []  Spinal Stenosis    Hematologic:  [] Easy bruising  [] Easy bleeding   [] Hypercoagulable state   [] Anemic Gastrointestinal:  [] Diarrhea   [] Vomiting  [] Gastroesophageal reflux/heartburn   [] Difficulty swallowing. [] Abdominal pain Genitourinary:  [] Chronic kidney disease   [] Difficult urination  [] Anuric   [] Blood in urine [] Frequent urination  [] Burning with urination   [] Hematuria Skin:  [] Rashes   [] Ulcers [] Wounds Psychological:  [] History of anxiety   []  History of major depression  []  Memory Difficulties      OBJECTIVE:   Physical Exam  BP 134/85 (BP Location: Right Arm)   Pulse 71   Resp 19   Ht 5\' 7"  (1.702 m)   Wt 257 lb (116.6 kg)   BMI 40.25 kg/m   Gen: WD/WN, NAD Head: [place]/AT, No temporalis wasting.  Ear/Nose/Throat: Hearing grossly intact, nares w/o erythema or drainage Eyes: PER, EOMI, sclera nonicteric.  Neck: Supple, no masses.  No JVD.  Pulmonary:  Good air movement, no use of accessory muscles.  Cardiac: RRR Vascular:  Scattered spider varicosities bilaterally.  5 to 7  mm varicosities present in the right lower leg Vessel Right Left  Radial Palpable Palpable  Dorsalis Pedis Palpable Palpable  Posterior Tibial Palpable Palpable   Gastrointestinal: soft, non-distended. No guarding/no peritoneal signs.  Musculoskeletal: M/S 5/5 throughout.  No deformity or atrophy.  Neurologic: Pain and light touch intact in extremities.  Symmetrical.  Speech is fluent. Motor exam as listed above. Psychiatric: Judgment intact, Mood & affect appropriate for pt's clinical situation. Dermatologic: No Venous rashes. No Ulcers Noted.  No changes consistent with cellulitis. Lymph : No Cervical lymphadenopathy, no  lichenification or skin changes of chronic lymphedema.       ASSESSMENT AND PLAN:  1. Varicose veins of right lower extremity with pain Recommend:  The patient has had successful ablation of the previously incompetent saphenous venous system but still has persistent symptoms of pain and swelling that are having a negative impact on daily life and daily activities.  Patient should undergo injection sclerotherapy to treat the residual varicosities.  The patient will continue wearing the graduated compression stockings and using the over-the-counter pain medications to treat her symptoms.   At this time the patient would like to discuss sclerotherapy with her husband.  She will contact her office if she decides to move forward at which time we  will work to facilitate sclerotherapy for the patient.   2. Essential hypertension Continue antihypertensive medications as already ordered, these medications have been reviewed and there are no changes at this time.   3. Swelling of limb No surgery or intervention at this point in time.  I have reviewed my discussion with the patient regarding venous insufficiency and why it causes symptoms. I have discussed with the patient the chronic skin changes that accompany venous insufficiency and the long term sequela such as ulceration. Patient will contnue wearing graduated compression stockings on a daily basis, as this has provided excellent control of his edema. The patient will put the stockings on first thing in the morning and removing them in the evening. The patient is reminded not to sleep in the stockings.  In addition, behavioral modification including elevation during the day will be initiated. Exercise is strongly encouraged.      Current Outpatient Medications on File Prior to Visit  Medication Sig Dispense Refill  . amLODipine (NORVASC) 2.5 MG tablet Take 2.5 mg by mouth daily.  1  . aspirin EC 81 MG tablet Take by mouth.    .  hydrochlorothiazide (HYDRODIURIL) 25 MG tablet TAKE 1 TABLET (25 MG TOTAL) BY MOUTH ONCE DAILY.  1  . Multiple Vitamin (MULTI-VITAMINS) TABS Take by mouth.    . sulfamethoxazole-trimethoprim (BACTRIM DS,SEPTRA DS) 800-160 MG tablet Take 1 tablet by mouth 2 (two) times daily. (Patient not taking: Reported on 08/23/2016) 20 tablet 0   No current facility-administered medications on file prior to visit.     There are no Patient Instructions on file for this visit. No follow-ups on file.   Kris Hartmann, NP  This note was completed with Sales executive.  Any errors are purely unintentional.

## 2019-06-05 ENCOUNTER — Other Ambulatory Visit (INDEPENDENT_AMBULATORY_CARE_PROVIDER_SITE_OTHER): Payer: Self-pay | Admitting: Nurse Practitioner

## 2020-01-22 ENCOUNTER — Other Ambulatory Visit: Payer: Self-pay | Admitting: Obstetrics and Gynecology

## 2020-01-22 DIAGNOSIS — Z1231 Encounter for screening mammogram for malignant neoplasm of breast: Secondary | ICD-10-CM

## 2020-01-28 ENCOUNTER — Ambulatory Visit
Admission: RE | Admit: 2020-01-28 | Discharge: 2020-01-28 | Disposition: A | Discharge status: Home / Self Care | Payer: Managed Care, Other (non HMO) | Source: Ambulatory Visit | Attending: Obstetrics and Gynecology | Admitting: Obstetrics and Gynecology

## 2020-01-28 ENCOUNTER — Other Ambulatory Visit: Payer: Self-pay

## 2020-01-28 DIAGNOSIS — Z1231 Encounter for screening mammogram for malignant neoplasm of breast: Secondary | ICD-10-CM | POA: Diagnosis present

## 2020-02-14 ENCOUNTER — Other Ambulatory Visit: Payer: Self-pay | Admitting: Obstetrics and Gynecology

## 2020-03-16 NOTE — H&P (Signed)
Colleen Rogers is a 64 y.o. female here for TAH  .pt with  8 weeks of PMB and noted prolapse uterine fibroid  . Recent EMBX showed atrophy  Prior cervical polyp . Known fibroids  U/s 02/11/20: Saline [place] not performed  cx mass seen= 3.85 x 2.84 x 3.32 cm   Fibroid ut 1 lt ant=48 mm 2 rt ant=47 mm 3 rt post= 34 mm 4 rt fundal= 49 mm  Endometrium=24.45 mm  ovs not seen  Past Medical History:  has a past medical history of Constipation due to slow transit, Family history of breast cancer, Fibroid, Heart murmur, unspecified, History of anemia, History of menorrhagia, Hypertension, LVH (left ventricular hypertrophy), Morbid obesity with BMI of 40.0-44.9, adult (CMS-HCC) (09/06/2016), Subarachnoid hemorrhage due to ruptured aneurysm (CMS-HCC), and Varicose veins of right lower extremity with inflammation (06/19/2017).  Past Surgical History:  has a past surgical history that includes other surgery (Dec 2003); Tubal ligation (1986); colonoscopy (05/15/2012); and Cystectomy. Family History: family history includes Breast cancer (age of onset: 42) in her sister; Coronary Artery Disease (Blocked arteries around heart) in her brother and mother; Diabetes in her mother; Heart block in her father; Heart failure in her mother; Lung cancer in her brother. Social History:  reports that she has never smoked. She has never used smokeless tobacco. She reports that she does not drink alcohol and does not use drugs. OB/GYN History:          OB History    Gravida  2   Para  2   Term  2   Preterm      AB      Living  2     SAB      TAB      Ectopic      Molar      Multiple      Live Births  2          Allergies: is allergic to clindamycin. Medications:  Current Outpatient Medications:  .  amLODIPine (NORVASC) 2.5 MG tablet, Take 1 tablet (2.5 mg total) by mouth once daily, Disp: 90 tablet, Rfl: 3 .  aspirin 81 MG EC tablet, Take 81 mg by mouth once daily., Disp: , Rfl:  .   hydroCHLOROthiazide (HYDRODIURIL) 25 MG tablet, Take 1 tablet (25 mg total) by mouth once daily, Disp: 90 tablet, Rfl: 3 .  multivitamin tablet, Take 1 tablet by mouth once daily., Disp: , Rfl:   Review of Systems: General:                      No fatigue or weight loss Eyes:                           No vision changes Ears:                            No hearing difficulty Respiratory:                No cough or shortness of breath Pulmonary:                  No asthma or shortness of breath Cardiovascular:           No chest pain, palpitations, dyspnea on exertion Gastrointestinal:          No abdominal bloating, chronic diarrhea, constipations, masses, pain or hematochezia Genitourinary:  No hematuria, dysuria, abnormal vaginal discharge, pelvic pain, Menometrorrhagia, + PMB Lymphatic:                   No swollen lymph nodes Musculoskeletal:         No muscle weakness Neurologic:                  No extremity weakness, syncope, seizure disorder Psychiatric:                  No history of depression, delusions or suicidal/homicidal ideation    Exam:      Vitals:   03/16/2020/   BP: 137/87  Pulse: 71    Body mass index is 41.81 kg/m.  WDWN / black female in NAD   Lungs: CTA  CV : RRR without murmur    Neck:  no thyromegaly Abdomen: soft , no mass, normal active bowel sounds,  non-tender, no rebound tenderness Pelvic: tanner stage 5 ,  External genitalia: vulva /labia no lesions Urethra: no prolapse Vagina: normal physiologic d/c Cervix:3x3 cm  fleshy colored prolapse polyp or fibroid , cervix is high  Uterus: 13 weeks irregular shape  Adnexa: no mass,  non-tender   Rectovaginal:   Impression:   The primary encounter diagnosis was Fibroid of cervix. A diagnosis of PMB (postmenopausal bleeding) was also pertinent to this visit. Prolapsed cervical fibroid    Plan:   I spoke to her that about definitive surgery ie TAH/ BSO  She understands  the need for surgery . All questions answered .    PCP to give medical clearance     .  Caroline Sauger, MD

## 2020-03-25 ENCOUNTER — Inpatient Hospital Stay: Admission: RE | Admit: 2020-03-25 | Payer: Managed Care, Other (non HMO) | Source: Ambulatory Visit

## 2020-04-01 ENCOUNTER — Other Ambulatory Visit: Payer: Managed Care, Other (non HMO)

## 2020-04-17 NOTE — H&P (Signed)
Colleen Rogers a 64 y.o.femalehere for TAH  .pt with  8 weeks of PMB and noted prolapse uterine fibroid  . Recent EMBX showed atrophy  Prior cervical polyp . Known fibroids  U/s 02/11/20: Saline [place] not performed  cx mass seen= 3.85 x 2.84 x 3.32 cm   Fibroid ut 1 lt ant=48 mm 2 rt ant=47 mm 3 rt post= 34 mm 4 rt fundal= 49 mm  Endometrium=24.45 mm  ovs not seen  Past Medical History:has a past medical history of Constipation due to slow transit, Family history of breast cancer, Fibroid, Heart murmur, unspecified, History of anemia, History of menorrhagia, Hypertension, LVH (left ventricular hypertrophy), Morbid obesity with BMI of 40.0-44.9, adult (CMS-HCC) (09/06/2016), Subarachnoid hemorrhage due to ruptured aneurysm (CMS-HCC), and Varicose veins of right lower extremity with inflammation (06/19/2017). Past Surgical History:has a past surgical history that includes other surgery (Dec 2003); Tubal ligation (1986); colonoscopy (05/15/2012); and Cystectomy. Family History:family history includes Breast cancer (age of onset: 40) in her sister; Coronary Artery Disease (Blocked arteries around heart) in her brother and mother; Diabetes in her mother; Heart block in her father; Heart failure in her mother; Lung cancer in her brother. Social History:reports that she has never smoked. She has never used smokeless tobacco. She reports that she does not drink alcohol and does not use drugs. OB/GYN History:                        OB History    Gravida  2   Para  2   Term  2   Preterm     AB     Living  2     SAB     TAB     Ectopic     Molar     Multiple     Live Births  2         Allergies:is allergic to clindamycin. Medications:  Current Outpatient Medications:  .amLODIPine (NORVASC) 2.5 MG tablet, Take 1 tablet (2.5 mg total) by mouth once daily, Disp: 90 tablet, Rfl: 3 .aspirin 81 MG EC tablet, Take 81 mg by mouth once  daily., Disp: , Rfl:  .hydroCHLOROthiazide (HYDRODIURIL) 25 MG tablet, Take 1 tablet (25 mg total) by mouth once daily, Disp: 90 tablet, Rfl: 3 .multivitamin tablet, Take 1 tablet by mouth once daily., Disp: , Rfl:   Review of Systems: General: No fatigue or weightloss Eyes:No vision changes Ears:No hearing difficulty Respiratory:No cough or shortness of breath Pulmonary: No asthma or shortness of breath Cardiovascular:No chest pain, palpitations, dyspnea on exertion Gastrointestinal:No abdominal bloating, chronic diarrhea, constipations, masses, pain or hematochezia Genitourinary:No hematuria, dysuria, abnormal vaginal discharge, pelvic pain, Menometrorrhagia, + PMB Lymphatic:No swollen lymph nodes Musculoskeletal:No muscle weakness Neurologic:No extremity weakness, syncope, seizure disorder Psychiatric:No history of depression, delusions or suicidal/homicidal ideation   Exam:      Vitals:   04/21/20  BP: 137/87  Pulse: 71    Body mass index is 41.81 kg/m.  WDWN / black female in NAD  Lungs: CTA  CV: RRR without murmur   Neck: no thyromegaly Abdomen: soft , no mass, normal active bowel sounds, non-tender, no rebound tenderness Pelvic: tanner stage 5 ,  External genitalia: vulva /labia no lesions Urethra: no prolapse Vagina: normal physiologic d/c Cervix:3x3 cm fleshy colored prolapse polyp or fibroid , cervix is high  Uterus:13 weeks irregular shape Adnexa:no mass, non-tender  Rectovaginal:   Impression:   The primary encounter diagnosis was Fibroid of cervix. A diagnosis  of PMB (postmenopausal bleeding) was also pertinent to this visit. Prolapsedcervical fibroid   Plan:   I spoke to her that about definitive surgery ie  TAH/ BSO  She understands the need for surgery . All questions answered .    PCP has cleared for surgery     .  Caroline Sauger, MD

## 2020-04-27 ENCOUNTER — Encounter
Admission: RE | Admit: 2020-04-27 | Discharge: 2020-04-27 | Disposition: A | Discharge status: Home / Self Care | Payer: Managed Care, Other (non HMO) | Source: Ambulatory Visit | Attending: Obstetrics and Gynecology | Admitting: Obstetrics and Gynecology

## 2020-04-27 ENCOUNTER — Other Ambulatory Visit: Payer: Self-pay

## 2020-04-27 NOTE — Patient Instructions (Addendum)
Your procedure is scheduled on: 05/08/20 Report to [place]. To find out your arrival time please call [phone] between 1PM - 3PM on 05/07/20.  Remember: Instructions that are not followed completely may result in serious medical risk, up to and including death, or upon the discretion of your surgeon and anesthesiologist your surgery may need to be rescheduled.     _X__ 1. Do not eat food after midnight the night before your procedure.                 No gum chewing or hard candies. You may drink clear liquids up to 2 hours                 before you are scheduled to arrive for your surgery- DO not drink clear                 liquids within 2 hours of the start of your surgery.                 Clear Liquids include:  water, apple juice without pulp, clear carbohydrate                 drink such as Clearfast or Gatorade, Black Coffee or Tea (Do not add                 anything to coffee or tea). Diabetics water only ENSURE "CLEAR" PRE SURGERY DRINK IS TO BE FINISHED 2 HOURS BEFORE ARRIVING ON 05/08/20  __X__2.  On the morning of surgery brush your teeth with toothpaste and water, you                 may rinse your mouth with mouthwash if you wish.  Do not swallow any              toothpaste of mouthwash.     _X__ 3.  No Alcohol for 24 hours before or after surgery.   _X__ 4.  Do Not Smoke or use e-cigarettes For 24 Hours Prior to Your Surgery.                 Do not use any chewable tobacco products for at least 6 hours prior to                 surgery.  ____  5.  Bring all medications with you on the day of surgery if instructed.   __X__  6.  Notify your doctor if there is any change in your medical condition      (cold, fever, infections).     Do not wear jewelry, make-up, hairpins, clips or nail polish. Do not wear lotions, powders, or perfumes.  Do not shave 48 hours prior to surgery. Men may shave face and neck. Do not  bring valuables to the hospital.    [facility] is not responsible for any belongings or valuables.  Contacts, dentures/partials or body piercings may not be worn into surgery. Bring a case for your contacts, glasses or hearing aids, a denture cup will be supplied. Leave your suitcase in the car. After surgery it may be brought to your room. For patients admitted to the hospital, discharge time is determined by your treatment team.   Patients discharged the day of surgery will not be allowed to drive home.   Please read over the following fact sheets that you were given:   MRSA Information  __X__ Take these medicines the morning of surgery with A SIP OF WATER:    1. amLODipine (NORVASC) 2.5 MG tablet  2.   3.   4.  5.  6.  ____ Fleet Enema (as directed)   __X__ Use CHG Soap/SAGE wipes as directed  ____ Use inhalers on the day of surgery  ____ Stop metformin/Janumet/Farxiga 2 days prior to surgery    ____ Take 1/2 of usual insulin dose the night before surgery. No insulin the morning          of surgery.   ____ Stop Blood Thinners Coumadin/Plavix/Xarelto/Pleta/Pradaxa/Eliquis/Effient/Aspirin  on   Or contact your Surgeon, Cardiologist or Medical Doctor regarding  ability to stop your blood thinners  __X__ Stop Anti-inflammatories 7 days before surgery such as Advil, Ibuprofen, Motrin,  BC or Goodies Powder, Naprosyn, Naproxen, Aleve, Aspirin    __X__ Stop all herbal supplements, fish oil or vitamin E until after surgery.    ____ Bring C-Pap to the hospital.     How to Use Chlorhexidine for Bathing Chlorhexidine gluconate (CHG) is a germ-killing (antiseptic) solution that is used to clean the skin. It can get rid of the bacteria that normally live on the skin and can keep them away for about 24 hours. To clean your skin with CHG, you may be given:  A CHG solution to use in the shower or as part of a sponge bath.  A prepackaged cloth that contains CHG. Cleaning your  skin with CHG may help lower the risk for infection:  While you are staying in the intensive care unit of the hospital.  If you have a vascular access, such as a central line, to provide short-term or long-term access to your veins.  If you have a catheter to drain urine from your bladder.  If you are on a ventilator. A ventilator is a machine that helps you breathe by moving air in and out of your lungs.  After surgery. What are the risks? Risks of using CHG include:  A skin reaction.  Hearing loss, if CHG gets in your ears.  Eye injury, if CHG gets in your eyes and is not rinsed out.  The CHG product catching fire. Make sure that you avoid smoking and flames after applying CHG to your skin. Do not use CHG:  If you have a chlorhexidine allergy or have previously reacted to chlorhexidine.  On babies younger than 29 months of age. How to use CHG solution  Use CHG only as told by your health care provider, and follow the instructions on the label.  Use the full amount of CHG as directed. Usually, this is one bottle. During a shower Follow these steps when using CHG solution during a shower (unless your health care provider gives you different instructions): 1. Start the shower. 2. Use your normal soap and shampoo to wash your face and hair. 3. Turn off the shower or move out of the shower stream. 4. Pour the CHG onto a clean washcloth. Do not use any type of brush or rough-edged sponge. 5. Starting at your neck, lather your body down to your toes. Make sure you follow these instructions: ? If you will be having surgery, pay special attention to the part of your body where you will be having surgery. Scrub this area for at least 1 minute. ? Do not use CHG on your head or face. If the solution gets into your ears or eyes, rinse them well with water. ? Avoid your  genital area. ? Avoid any areas of skin that have broken skin, cuts, or scrapes. ? Scrub your back and under your  arms. Make sure to wash skin folds. 6. Let the lather sit on your skin for 1-2 minutes or as long as told by your health care provider. 7. Thoroughly rinse your entire body in the shower. Make sure that all body creases and crevices are rinsed well. 8. Dry off with a clean towel. Do not put any substances on your body afterward--such as powder, lotion, or perfume--unless you are told to do so by your health care provider. Only use lotions that are recommended by the manufacturer. 9. Put on clean clothes or pajamas. 10. If it is the night before your surgery, sleep in clean sheets.  During a sponge bath Follow these steps when using CHG solution during a sponge bath (unless your health care provider gives you different instructions): 1. Use your normal soap and shampoo to wash your face and hair. 2. Pour the CHG onto a clean washcloth. 3. Starting at your neck, lather your body down to your toes. Make sure you follow these instructions: ? If you will be having surgery, pay special attention to the part of your body where you will be having surgery. Scrub this area for at least 1 minute. ? Do not use CHG on your head or face. If the solution gets into your ears or eyes, rinse them well with water. ? Avoid your genital area. ? Avoid any areas of skin that have broken skin, cuts, or scrapes. ? Scrub your back and under your arms. Make sure to wash skin folds. 4. Let the lather sit on your skin for 1-2 minutes or as long as told by your health care provider. 5. Using a different clean, wet washcloth, thoroughly rinse your entire body. Make sure that all body creases and crevices are rinsed well. 6. Dry off with a clean towel. Do not put any substances on your body afterward--such as powder, lotion, or perfume--unless you are told to do so by your health care provider. Only use lotions that are recommended by the manufacturer. 7. Put on clean clothes or pajamas. 8. If it is the night before your  surgery, sleep in clean sheets. How to use CHG prepackaged cloths  Only use CHG cloths as told by your health care provider, and follow the instructions on the label.  Use the CHG cloth on clean, dry skin.  Do not use the CHG cloth on your head or face unless your health care provider tells you to.  When washing with the CHG cloth: ? Avoid your genital area. ? Avoid any areas of skin that have broken skin, cuts, or scrapes. Before surgery Follow these steps when using a CHG cloth to clean before surgery (unless your health care provider gives you different instructions): 1. Using the CHG cloth, vigorously scrub the part of your body where you will be having surgery. Scrub using a back-and-forth motion for 3 minutes. The area on your body should be completely wet with CHG when you are done scrubbing. 2. Do not rinse. Discard the cloth and let the area air-dry. Do not put any substances on the area afterward, such as powder, lotion, or perfume. 3. Put on clean clothes or pajamas. 4. If it is the night before your surgery, sleep in clean sheets.  For general bathing Follow these steps when using CHG cloths for general bathing (unless your health care provider gives you  different instructions). 1. Use a separate CHG cloth for each area of your body. Make sure you wash between any folds of skin and between your fingers and toes. Wash your body in the following order, switching to a new cloth after each step: ? The front of your neck, shoulders, and chest. ? Both of your arms, under your arms, and your hands. ? Your stomach and groin area, avoiding the genitals. ? Your right leg and foot. ? Your left leg and foot. ? The back of your neck, your back, and your buttocks. 2. Do not rinse. Discard the cloth and let the area air-dry. Do not put any substances on your body afterward--such as powder, lotion, or perfume--unless you are told to do so by your health care provider. Only use lotions that  are recommended by the manufacturer. 3. Put on clean clothes or pajamas. Contact a health care provider if:  Your skin gets irritated after scrubbing.  You have questions about using your solution or cloth. Get help right away if:  Your eyes become very red or swollen.  Your eyes itch badly.  Your skin itches badly and is red or swollen.  Your hearing changes.  You have trouble seeing.  You have swelling or tingling in your mouth or throat.  You have trouble breathing.  You swallow any chlorhexidine. Summary  Chlorhexidine gluconate (CHG) is a germ-killing (antiseptic) solution that is used to clean the skin. Cleaning your skin with CHG may help to lower your risk for infection.  You may be given CHG to use for bathing. It may be in a bottle or in a prepackaged cloth to use on your skin. Carefully follow your health care provider's instructions and the instructions on the product label.  Do not use CHG if you have a chlorhexidine allergy.  Contact your health care provider if your skin gets irritated after scrubbing. This information is not intended to replace advice given to you by your health care provider. Make sure you discuss any questions you have with your health care provider. Document Revised: 10/04/2018 Document Reviewed: 06/15/2017 Elsevier Patient Education  [place].    How to Use an Incentive Spirometer An incentive spirometer is a tool that measures how well you are filling your lungs with each breath. Learning to take long, deep breaths using this tool can help you keep your lungs clear and active. This may help to reverse or lessen your chance of developing breathing (pulmonary) problems, especially infection. You may be asked to use a spirometer:  After a surgery.  If you have a lung problem or a history of smoking.  After a long period of time when you have been unable to move or be active. If the spirometer includes an indicator to show the  highest number that you have reached, your health care provider or respiratory therapist will help you set a goal. Keep a list (log) of your progress as told by your health care provider. What are the risks?  Breathing too quickly may cause dizziness or cause you to pass out. Take your time so you do not get dizzy or light-headed.  If you are in pain, you may need to take pain medicine before doing incentive spirometry. It is harder to take a deep breath if you are having pain. How to use your incentive spirometer  1. Sit up on the edge of your bed or on a chair. 2. Hold the incentive spirometer so that it is in  an upright position. 3. Before you use the spirometer, breathe out normally. 4. Place the mouthpiece in your mouth. Make sure your lips are closed tightly around it. 5. Breathe in slowly and as deeply as you can through your mouth, causing the piston or the ball to rise toward the top of the chamber. 6. Hold your breath for 3-5 seconds, or for as long as possible. ? If the spirometer includes a coach indicator, use this to guide you in breathing. Slow down your breathing if the indicator goes above the marked areas. 7. Remove the mouthpiece from your mouth and breathe out normally. The piston or ball will return to the bottom of the chamber. 8. Rest for a few seconds, then repeat the steps 10 or more times. ? Take your time and take a few normal breaths between deep breaths so that you do not get dizzy or light-headed. ? Do this every 1-2 hours when you are awake. 9. If the spirometer includes a goal marker to show the highest number you have reached (best effort), use this as a goal to work toward during each repetition. 10. After each set of 10 deep breaths, cough a few times. This will help to make sure that your lungs are clear. ? If you have an incision on your chest or abdomen from surgery, place a pillow or a rolled-up towel firmly against the incision when you cough. This can  help to reduce pain from coughing. General tips  When you become able to get out of bed, walk around often and continue to cough to help clear your lungs.  Keep using the incentive spirometer until your health care provider says it is okay to stop using it. If you have been in the hospital, you may be told to keep using the spirometer at home. Contact a health care provider if:  You are having difficulty using the spirometer.  You have trouble using the spirometer as often as instructed.  Your pain medicine is not giving enough relief for you to use the spirometer as told.  You have a fever.  You develop shortness of breath. Get help right away if:  You develop a cough with bloody mucus from the lungs (bloody sputum).  You have fluid or blood coming from an incision site after you cough. Summary  An incentive spirometer is a tool that can help you learn to take long, deep breaths to keep your lungs clear and active.  You may be asked to use a spirometer after a surgery, if you have a lung problem or a history of smoking, or if you have been inactive for a long period of time.  Use your incentive spirometer as instructed every 1-2 hours while you are awake.  If you have an incision on your chest or abdomen, place a pillow or a rolled-up towel firmly against your incision when you cough. This will help to reduce pain. This information is not intended to replace advice given to you by your health care provider. Make sure you discuss any questions you have with your health care provider. Document Revised: 02/15/2019 Document Reviewed: 05/31/2017 Elsevier Patient Education  2020 [employer].

## 2020-04-29 ENCOUNTER — Other Ambulatory Visit: Payer: Self-pay | Admitting: Obstetrics and Gynecology

## 2020-05-06 ENCOUNTER — Other Ambulatory Visit: Payer: Managed Care, Other (non HMO)

## 2020-05-27 NOTE — H&P (Signed)
Colleen Rogers a 64 y.o.femalehere forTAH .ptwith 16weeks of PMB and noted prolapse uterine fibroid. Recent EMBX showed atrophy  Prior cervical polyp . Known fibroids  U/s 02/11/20: Saline [place] not performed  cx mass seen= 3.85 x 2.84 x 3.32 cm   Fibroid ut 1 lt ant=48 mm 2 rt ant=47 mm 3 rt post= 34 mm 4 rt fundal= 49 mm  Endometrium=24.45 mm  ovs not seen  Past Medical History:has a past medical history of Constipation due to slow transit, Family history of breast cancer, Fibroid, Heart murmur, unspecified, History of anemia, History of menorrhagia, Hypertension, LVH (left ventricular hypertrophy), Morbid obesity with BMI of 40.0-44.9, adult (CMS-HCC) (09/06/2016), Subarachnoid hemorrhage due to ruptured aneurysm (CMS-HCC), and Varicose veins of right lower extremity with inflammation (06/19/2017). Past Surgical History:has a past surgical history that includes other surgery (Dec 2003); Tubal ligation (1986); colonoscopy (05/15/2012); and Cystectomy. Family History:family history includes Breast cancer (age of onset: 5) in her sister; Coronary Artery Disease (Blocked arteries around heart) in her brother and mother; Diabetes in her mother; Heart block in her father; Heart failure in her mother; Lung cancer in her brother. Social History:reports that she has never smoked. She has never used smokeless tobacco. She reports that she does not drink alcohol and does not use drugs. OB/GYN History:                                             OB History    Gravida  2   Para  2   Term  2   Preterm     AB     Living  2      SAB     TAB     Ectopic     Molar     Multiple     Live Births  2          Allergies:is allergic to clindamycin. Medications:  Current Outpatient Medications:  .amLODIPine (NORVASC) 2.5 MG tablet, Take 1 tablet (2.5 mg total) by mouth once daily, Disp: 90 tablet, Rfl: 3 .aspirin 81 MG  EC tablet, Take 81 mg by mouth once daily., Disp: , Rfl:  .hydroCHLOROthiazide (HYDRODIURIL) 25 MG tablet, Take 1 tablet (25 mg total) by mouth once daily, Disp: 90 tablet, Rfl: 3 .multivitamin tablet, Take 1 tablet by mouth once daily., Disp: , Rfl:   Review of Systems: General: No fatigue or weightloss Eyes:No vision changes Ears:No hearing difficulty Respiratory:No cough or shortness of breath Pulmonary: No asthma or shortness of breath Cardiovascular:No chest pain, palpitations, dyspnea on exertion Gastrointestinal:No abdominal bloating, chronic diarrhea, constipations, masses, pain or hematochezia Genitourinary:No hematuria, dysuria, abnormal vaginal discharge, pelvic pain, Menometrorrhagia, + PMB Lymphatic:No swollen lymph nodes Musculoskeletal:No muscle weakness Neurologic:No extremity weakness, syncope, seizure disorder Psychiatric:No history of depression, delusions or suicidal/homicidal ideation   Exam:      Vitals:   05/28/20  BP: 137/87  Pulse: 71    Body mass index is 41.81 kg/m.  WDWN / black female in NAD  Lungs: CTA  CV: RRR without murmur   Neck: no thyromegaly Abdomen: soft , no mass, normal active bowel sounds, non-tender, no rebound tenderness Pelvic: tanner stage 5 ,  External genitalia: vulva /labia no lesions Urethra: no prolapse Vagina: normal physiologic d/c Cervix:3x3 cm fleshy colored  fibroid , cervix is high  Uterus:13 weeks irregular shape Adnexa:no mass, non-tender  Rectovaginal:  Impression:   The primary encounter diagnosis was Fibroid of cervix. A diagnosis of PMB (postmenopausal bleeding) was also pertinent to this visit. Prolapsedcervical fibroid   Plan:   I spoke to her that about  definitive surgery ie TAH/ BSO  She understands the need for surgery . All questions answered .   PCP has cleared for surgery

## 2020-06-05 ENCOUNTER — Inpatient Hospital Stay: Admission: RE | Admit: 2020-06-05 | Payer: Managed Care, Other (non HMO) | Source: Ambulatory Visit

## 2020-06-10 ENCOUNTER — Other Ambulatory Visit: Payer: Self-pay

## 2020-06-10 ENCOUNTER — Other Ambulatory Visit
Admission: RE | Admit: 2020-06-10 | Discharge: 2020-06-10 | Disposition: A | Discharge status: Home / Self Care | Payer: Managed Care, Other (non HMO) | Source: Ambulatory Visit | Attending: Obstetrics and Gynecology | Admitting: Obstetrics and Gynecology

## 2020-06-10 DIAGNOSIS — Z20822 Contact with and (suspected) exposure to covid-19: Secondary | ICD-10-CM | POA: Insufficient documentation

## 2020-06-10 DIAGNOSIS — Z01812 Encounter for preprocedural laboratory examination: Secondary | ICD-10-CM | POA: Insufficient documentation

## 2020-06-10 LAB — BASIC METABOLIC PANEL
Anion gap: 8 (ref 5–15)
BUN: 15 mg/dL (ref 8–23)
CO2: 27 mmol/L (ref 22–32)
Calcium: 9.1 mg/dL (ref 8.9–10.3)
Chloride: 106 mmol/L (ref 98–111)
Creatinine, Ser: 0.45 mg/dL (ref 0.44–1.00)
GFR, Estimated: >60 mL/min (ref >60)
Glucose, Bld: 98 mg/dL (ref 70–99)
Potassium: 3.8 mmol/L (ref 3.5–5.1)
Sodium: 141 mmol/L (ref 135–145)

## 2020-06-10 LAB — CBC
HCT: 40.5 % (ref 36.0–46.0)
Hemoglobin: 12.9 g/dL (ref 12.0–15.0)
MCH: 30.6 pg (ref 26.0–34.0)
MCHC: 31.9 g/dL (ref 30.0–36.0)
MCV: 96.2 fL (ref 80.0–100.0)
Platelets: 282 10*3/uL (ref 150–400)
RBC: 4.21 MIL/uL (ref 3.87–5.11)
RDW: 13.6 % (ref 11.5–15.5)
WBC: 5.2 10*3/uL (ref 4.0–10.5)
nRBC: 0 % (ref 0.0–0.2)

## 2020-06-10 LAB — SARS CORONAVIRUS 2 (TAT 6-24 HRS): SARS Coronavirus 2: NEGATIVE

## 2020-06-10 LAB — TYPE AND SCREEN
ABO/RH(D): O POS
Antibody Screen: NEGATIVE

## 2020-06-12 ENCOUNTER — Encounter
Admission: RE | Disposition: A | Discharge status: Home / Self Care | Payer: Self-pay | Source: Home / Self Care | Attending: Obstetrics and Gynecology

## 2020-06-12 ENCOUNTER — Inpatient Hospital Stay: Payer: Managed Care, Other (non HMO) | Admitting: Urgent Care

## 2020-06-12 ENCOUNTER — Encounter: Payer: Self-pay | Admitting: Obstetrics and Gynecology

## 2020-06-12 ENCOUNTER — Inpatient Hospital Stay
Admission: RE | Admit: 2020-06-12 | Discharge: 2020-06-14 | DRG: 742 | Disposition: A | Discharge status: Home / Self Care | Payer: Managed Care, Other (non HMO) | Attending: Obstetrics and Gynecology | Admitting: Obstetrics and Gynecology

## 2020-06-12 ENCOUNTER — Other Ambulatory Visit: Payer: Self-pay

## 2020-06-12 DIAGNOSIS — N814 Uterovaginal prolapse, unspecified: Secondary | ICD-10-CM | POA: Diagnosis present

## 2020-06-12 DIAGNOSIS — N950 Postmenopausal bleeding: Principal | ICD-10-CM | POA: Diagnosis present

## 2020-06-12 DIAGNOSIS — Z20822 Contact with and (suspected) exposure to covid-19: Secondary | ICD-10-CM | POA: Diagnosis present

## 2020-06-12 DIAGNOSIS — D260 Other benign neoplasm of cervix uteri: Secondary | ICD-10-CM | POA: Diagnosis present

## 2020-06-12 DIAGNOSIS — I10 Essential (primary) hypertension: Secondary | ICD-10-CM | POA: Diagnosis present

## 2020-06-12 DIAGNOSIS — D259 Leiomyoma of uterus, unspecified: Secondary | ICD-10-CM | POA: Diagnosis present

## 2020-06-12 DIAGNOSIS — Z881 Allergy status to other antibiotic agents status: Secondary | ICD-10-CM | POA: Diagnosis not present

## 2020-06-12 DIAGNOSIS — Z9889 Other specified postprocedural states: Secondary | ICD-10-CM

## 2020-06-12 DIAGNOSIS — Z6841 Body Mass Index (BMI) 40.0 and over, adult: Secondary | ICD-10-CM

## 2020-06-12 HISTORY — PX: HYSTERECTOMY ABDOMINAL WITH SALPINGECTOMY: SHX6725

## 2020-06-12 HISTORY — PX: CYSTOSCOPY: SHX5120

## 2020-06-12 LAB — BASIC METABOLIC PANEL WITH GFR
Anion gap: 6 (ref 5–15)
BUN: 13 mg/dL (ref 8–23)
CO2: 27 mmol/L (ref 22–32)
Calcium: 8.8 mg/dL — ABNORMAL LOW (ref 8.9–10.3)
Chloride: 106 mmol/L (ref 98–111)
Creatinine, Ser: 0.66 mg/dL (ref 0.44–1.00)
GFR, Estimated: >60 mL/min
Glucose, Bld: 144 mg/dL — ABNORMAL HIGH (ref 70–99)
Potassium: 3.7 mmol/L (ref 3.5–5.1)
Sodium: 139 mmol/L (ref 135–145)

## 2020-06-12 LAB — CBC
HCT: 37.1 % (ref 36.0–46.0)
Hemoglobin: 11.5 g/dL — ABNORMAL LOW (ref 12.0–15.0)
MCH: 30.1 pg (ref 26.0–34.0)
MCHC: 31 g/dL (ref 30.0–36.0)
MCV: 97.1 fL (ref 80.0–100.0)
Platelets: 266 10*3/uL (ref 150–400)
RBC: 3.82 MIL/uL — ABNORMAL LOW (ref 3.87–5.11)
RDW: 13.3 % (ref 11.5–15.5)
WBC: 12 10*3/uL — ABNORMAL HIGH (ref 4.0–10.5)
nRBC: 0 % (ref 0.0–0.2)

## 2020-06-12 LAB — ABO/RH: ABO/RH(D): O POS

## 2020-06-12 SURGERY — HYSTERECTOMY, TOTAL, ABDOMINAL, WITH SALPINGECTOMY
Anesthesia: General

## 2020-06-12 MED ORDER — FAMOTIDINE 20 MG PO TABS: 20.0000 mg | ORAL_TABLET | Freq: Once | ORAL | Status: AC

## 2020-06-12 MED ORDER — LACTATED RINGERS IV SOLN: INTRAVENOUS | Status: DC

## 2020-06-12 MED ORDER — DIPHENHYDRAMINE HCL 50 MG/ML IJ SOLN: 12.5000 mg | Freq: Four times a day (QID) | INTRAMUSCULAR | Status: DC | PRN

## 2020-06-12 MED ORDER — PROPOFOL 10 MG/ML IV BOLUS
INTRAVENOUS | Status: AC
  Filled 2020-06-12: qty 20

## 2020-06-12 MED ORDER — KETOROLAC TROMETHAMINE 30 MG/ML IJ SOLN: 30.0000 mg | Freq: Three times a day (TID) | INTRAMUSCULAR | Status: DC | PRN

## 2020-06-12 MED ORDER — GLYCOPYRROLATE 0.2 MG/ML IJ SOLN
INTRAMUSCULAR | Status: DC | PRN
  Administered 2020-06-12: .2 mg via INTRAVENOUS

## 2020-06-12 MED ORDER — SUGAMMADEX SODIUM 200 MG/2ML IV SOLN
INTRAVENOUS | Status: DC | PRN
  Administered 2020-06-12: 200 mg via INTRAVENOUS

## 2020-06-12 MED ORDER — ALUM & MAG HYDROXIDE-SIMETH 200-200-20 MG/5ML PO SUSP: 30.0000 mL | ORAL | Status: DC | PRN

## 2020-06-12 MED ORDER — ACETAMINOPHEN 500 MG PO TABS: 1000.0000 mg | ORAL_TABLET | ORAL | Status: AC

## 2020-06-12 MED ORDER — GABAPENTIN 300 MG PO CAPS: 300.0000 mg | ORAL_CAPSULE | ORAL | Status: AC

## 2020-06-12 MED ORDER — SODIUM CHLORIDE 0.9 % IV SOLN
INTRAVENOUS | Status: DC | PRN
  Administered 2020-06-12: 42 mL

## 2020-06-12 MED ORDER — VASOPRESSIN 20 UNIT/ML IV SOLN
INTRAVENOUS | Status: AC
  Filled 2020-06-12: qty 1

## 2020-06-12 MED ORDER — ONDANSETRON HCL 4 MG/2ML IJ SOLN: 4.0000 mg | Freq: Four times a day (QID) | INTRAMUSCULAR | Status: DC | PRN

## 2020-06-12 MED ORDER — CHLORHEXIDINE GLUCONATE 0.12 % MT SOLN: 15.0000 mL | Freq: Once | OROMUCOSAL | Status: AC

## 2020-06-12 MED ORDER — FENTANYL CITRATE (PF) 100 MCG/2ML IJ SOLN
25.0000 ug | INTRAMUSCULAR | Status: DC | PRN
  Administered 2020-06-12: 25 ug via INTRAVENOUS

## 2020-06-12 MED ORDER — CHLORHEXIDINE GLUCONATE 0.12 % MT SOLN
OROMUCOSAL | Status: AC
  Administered 2020-06-12: 15 mL via OROMUCOSAL
  Filled 2020-06-12: qty 15

## 2020-06-12 MED ORDER — GABAPENTIN 300 MG PO CAPS
300.0000 mg | ORAL_CAPSULE | Freq: Every day | ORAL | Status: DC
  Administered 2020-06-12 – 2020-06-13 (×2): 300 mg via ORAL
  Filled 2020-06-12 (×2): qty 1

## 2020-06-12 MED ORDER — INFLUENZA VAC SPLIT QUAD 0.5 ML IM SUSY: 0.5000 mL | PREFILLED_SYRINGE | INTRAMUSCULAR | Status: DC | PRN

## 2020-06-12 MED ORDER — FENTANYL CITRATE (PF) 100 MCG/2ML IJ SOLN
INTRAMUSCULAR | Status: DC | PRN
  Administered 2020-06-12 (×2): 50 ug via INTRAVENOUS

## 2020-06-12 MED ORDER — FENTANYL CITRATE (PF) 100 MCG/2ML IJ SOLN
INTRAMUSCULAR | Status: AC
  Filled 2020-06-12: qty 2

## 2020-06-12 MED ORDER — OXYCODONE HCL 5 MG PO TABS: 5.0000 mg | ORAL_TABLET | Freq: Once | ORAL | Status: DC | PRN

## 2020-06-12 MED ORDER — ENOXAPARIN SODIUM 60 MG/0.6ML ~~LOC~~ SOLN
0.5000 mg/kg | SUBCUTANEOUS | Status: DC
  Administered 2020-06-13: 60 mg via SUBCUTANEOUS
  Filled 2020-06-12 (×4): qty 0.6

## 2020-06-12 MED ORDER — KETOROLAC TROMETHAMINE 30 MG/ML IJ SOLN
INTRAMUSCULAR | Status: AC
  Filled 2020-06-12: qty 1

## 2020-06-12 MED ORDER — SIMETHICONE 80 MG PO CHEW
80.0000 mg | CHEWABLE_TABLET | Freq: Four times a day (QID) | ORAL | Status: DC | PRN
  Filled 2020-06-12: qty 1

## 2020-06-12 MED ORDER — ACETAMINOPHEN 500 MG PO TABS
ORAL_TABLET | ORAL | Status: AC
  Administered 2020-06-12: 1000 mg via ORAL
  Filled 2020-06-12: qty 2

## 2020-06-12 MED ORDER — ONDANSETRON HCL 4 MG PO TABS: 4.0000 mg | ORAL_TABLET | Freq: Four times a day (QID) | ORAL | Status: DC | PRN

## 2020-06-12 MED ORDER — DEXAMETHASONE SODIUM PHOSPHATE 10 MG/ML IJ SOLN
INTRAMUSCULAR | Status: DC | PRN
  Administered 2020-06-12: 8 mg via INTRAVENOUS

## 2020-06-12 MED ORDER — ROCURONIUM BROMIDE 10 MG/ML (PF) SYRINGE
PREFILLED_SYRINGE | INTRAVENOUS | Status: AC
  Filled 2020-06-12: qty 10

## 2020-06-12 MED ORDER — FAMOTIDINE 20 MG PO TABS
ORAL_TABLET | ORAL | Status: AC
  Administered 2020-06-12: 20 mg via ORAL
  Filled 2020-06-12: qty 1

## 2020-06-12 MED ORDER — PROPOFOL 500 MG/50ML IV EMUL
INTRAVENOUS | Status: AC
  Filled 2020-06-12: qty 50

## 2020-06-12 MED ORDER — DIPHENHYDRAMINE HCL 12.5 MG/5ML PO ELIX
12.5000 mg | ORAL_SOLUTION | Freq: Four times a day (QID) | ORAL | Status: DC | PRN
  Filled 2020-06-12: qty 5

## 2020-06-12 MED ORDER — DEXTROSE 5 % IV SOLN
3.0000 g | Freq: Once | INTRAVENOUS | Status: AC
  Administered 2020-06-12: 3 g via INTRAVENOUS
  Filled 2020-06-12: qty 3000

## 2020-06-12 MED ORDER — BUPIVACAINE LIPOSOME 1.3 % IJ SUSP
INTRAMUSCULAR | Status: AC
  Filled 2020-06-12: qty 20

## 2020-06-12 MED ORDER — PROPOFOL 500 MG/50ML IV EMUL
INTRAVENOUS | Status: DC | PRN
  Administered 2020-06-12: 160 ug/kg/min via INTRAVENOUS

## 2020-06-12 MED ORDER — GLYCOPYRROLATE 0.2 MG/ML IJ SOLN
INTRAMUSCULAR | Status: AC
  Filled 2020-06-12: qty 1

## 2020-06-12 MED ORDER — ORAL CARE MOUTH RINSE: 15.0000 mL | Freq: Once | OROMUCOSAL | Status: AC

## 2020-06-12 MED ORDER — KETOROLAC TROMETHAMINE 30 MG/ML IJ SOLN
INTRAMUSCULAR | Status: DC | PRN
  Administered 2020-06-12: 30 mg via INTRAVENOUS

## 2020-06-12 MED ORDER — SODIUM CHLORIDE (PF) 0.9 % IJ SOLN
INTRAMUSCULAR | Status: AC
  Filled 2020-06-12: qty 50

## 2020-06-12 MED ORDER — PROPOFOL 10 MG/ML IV BOLUS
INTRAVENOUS | Status: DC | PRN
  Administered 2020-06-12: 150 mg via INTRAVENOUS

## 2020-06-12 MED ORDER — BUPIVACAINE HCL 0.5 % IJ SOLN
INTRAMUSCULAR | Status: DC | PRN
  Administered 2020-06-12: 18 mL

## 2020-06-12 MED ORDER — LIDOCAINE-EPINEPHRINE 1 %-1:100000 IJ SOLN
INTRAMUSCULAR | Status: AC
  Filled 2020-06-12: qty 1

## 2020-06-12 MED ORDER — DEXAMETHASONE SODIUM PHOSPHATE 10 MG/ML IJ SOLN
INTRAMUSCULAR | Status: AC
  Filled 2020-06-12: qty 1

## 2020-06-12 MED ORDER — LIDOCAINE HCL (PF) 2 % IJ SOLN
INTRAMUSCULAR | Status: AC
  Filled 2020-06-12: qty 5

## 2020-06-12 MED ORDER — ONDANSETRON HCL 4 MG/2ML IJ SOLN
INTRAMUSCULAR | Status: DC | PRN
  Administered 2020-06-12: 4 mg via INTRAVENOUS

## 2020-06-12 MED ORDER — SODIUM CHLORIDE 0.9% FLUSH: 9.0000 mL | INTRAVENOUS | Status: DC | PRN

## 2020-06-12 MED ORDER — MIDAZOLAM HCL 2 MG/2ML IJ SOLN
INTRAMUSCULAR | Status: DC | PRN
  Administered 2020-06-12: 2 mg via INTRAVENOUS

## 2020-06-12 MED ORDER — MIDAZOLAM HCL 2 MG/2ML IJ SOLN
INTRAMUSCULAR | Status: AC
  Filled 2020-06-12: qty 2

## 2020-06-12 MED ORDER — ONDANSETRON HCL 4 MG/2ML IJ SOLN
INTRAMUSCULAR | Status: AC
  Filled 2020-06-12: qty 2

## 2020-06-12 MED ORDER — POVIDONE-IODINE 10 % EX SWAB
2.0000 "application " | Freq: Once | CUTANEOUS | Status: AC
  Administered 2020-06-12: 2 via TOPICAL

## 2020-06-12 MED ORDER — MORPHINE SULFATE 2 MG/ML IV SOLN
INTRAVENOUS | Status: DC
  Administered 2020-06-12: 1.85 mL via INTRAVENOUS
  Administered 2020-06-13: 2.08 mL via INTRAVENOUS
  Administered 2020-06-13: 2.02 mL via INTRAVENOUS
  Filled 2020-06-12: qty 25

## 2020-06-12 MED ORDER — GABAPENTIN 300 MG PO CAPS
ORAL_CAPSULE | ORAL | Status: AC
  Administered 2020-06-12: 300 mg via ORAL
  Filled 2020-06-12: qty 1

## 2020-06-12 MED ORDER — ONDANSETRON HCL 4 MG/2ML IJ SOLN: 4.0000 mg | Freq: Once | INTRAMUSCULAR | Status: DC | PRN

## 2020-06-12 MED ORDER — BUPIVACAINE HCL (PF) 0.5 % IJ SOLN
INTRAMUSCULAR | Status: AC
  Filled 2020-06-12: qty 30

## 2020-06-12 MED ORDER — HYDROMORPHONE HCL 1 MG/ML IJ SOLN
INTRAMUSCULAR | Status: AC
  Filled 2020-06-12: qty 1

## 2020-06-12 MED ORDER — ROCURONIUM BROMIDE 100 MG/10ML IV SOLN
INTRAVENOUS | Status: DC | PRN
  Administered 2020-06-12: 10 mg via INTRAVENOUS
  Administered 2020-06-12: 30 mg via INTRAVENOUS
  Administered 2020-06-12: 10 mg via INTRAVENOUS
  Administered 2020-06-12: 20 mg via INTRAVENOUS
  Administered 2020-06-12: 10 mg via INTRAVENOUS
  Administered 2020-06-12: 50 mg via INTRAVENOUS

## 2020-06-12 MED ORDER — NALOXONE HCL 0.4 MG/ML IJ SOLN
0.4000 mg | INTRAMUSCULAR | Status: DC | PRN
  Filled 2020-06-12: qty 1

## 2020-06-12 MED ORDER — HYDROMORPHONE HCL 1 MG/ML IJ SOLN
INTRAMUSCULAR | Status: DC | PRN
Start: 2020-06-12 — End: 2020-06-12
  Administered 2020-06-12: 1 mg via INTRAVENOUS

## 2020-06-12 MED ORDER — LIDOCAINE HCL (CARDIAC) PF 100 MG/5ML IV SOSY
PREFILLED_SYRINGE | INTRAVENOUS | Status: DC | PRN
  Administered 2020-06-12: 80 mg via INTRAVENOUS

## 2020-06-12 MED ORDER — AMLODIPINE BESYLATE 5 MG PO TABS
2.5000 mg | ORAL_TABLET | Freq: Every day | ORAL | Status: DC
  Administered 2020-06-13 – 2020-06-14 (×2): 2.5 mg via ORAL
  Filled 2020-06-12 (×3): qty 0.5

## 2020-06-12 MED ORDER — OXYCODONE HCL 5 MG/5ML PO SOLN: 5.0000 mg | Freq: Once | ORAL | Status: DC | PRN

## 2020-06-12 SURGICAL SUPPLY — 57 items
CHLORAPREP W/TINT 26 (MISCELLANEOUS) ×6 IMPLANT
CNTNR SPEC 2.5X3XGRAD LEK (MISCELLANEOUS) ×2
CONT SPEC 4OZ STER OR WHT (MISCELLANEOUS) ×2
CONTAINER SPEC 2.5X3XGRAD LEK (MISCELLANEOUS) IMPLANT
COUNTER NEEDLE 20/40 LG (NEEDLE) ×2 IMPLANT
COVER WAND RF STERILE (DRAPES) ×4 IMPLANT
DRAPE LAP W/FLUID (DRAPES) ×4 IMPLANT
DRAPE UNDER BUTTOCK W/FLU (DRAPES) ×4 IMPLANT
DRSG TELFA 3X8 NADH (GAUZE/BANDAGES/DRESSINGS) ×4 IMPLANT
ELECT BLADE 6.5 EXT (BLADE) ×2 IMPLANT
ELECT REM PT RETURN 9FT ADLT (ELECTROSURGICAL) ×4
ELECTRODE REM PT RTRN 9FT ADLT (ELECTROSURGICAL) ×2 IMPLANT
GAUZE 4X4 16PLY RFD (DISPOSABLE) ×4 IMPLANT
GAUZE SPONGE 4X4 12PLY STRL (GAUZE/BANDAGES/DRESSINGS) ×4 IMPLANT
GLOVE BIO SURGEON STRL SZ7 (GLOVE) ×10 IMPLANT
GLOVE BIOGEL PI IND STRL 6.5 (GLOVE) ×2 IMPLANT
GLOVE BIOGEL PI INDICATOR 6.5 (GLOVE) ×2
GLOVE SURG SYN 8.0 (GLOVE) ×16 IMPLANT
GLOVE SURG SYN 8.0 PF PI (GLOVE) ×2 IMPLANT
GOWN STRL REUS W/ TWL LRG LVL3 (GOWN DISPOSABLE) ×4 IMPLANT
GOWN STRL REUS W/ TWL XL LVL3 (GOWN DISPOSABLE) ×2 IMPLANT
GOWN STRL REUS W/TWL LRG LVL3 (GOWN DISPOSABLE) ×4
GOWN STRL REUS W/TWL XL LVL3 (GOWN DISPOSABLE) ×4
KIT TURNOVER CYSTO (KITS) ×4 IMPLANT
LABEL OR SOLS (LABEL) ×4 IMPLANT
MANIFOLD NEPTUNE II (INSTRUMENTS) ×4 IMPLANT
NDL HPO THNWL 1X22GA REG BVL (NEEDLE) IMPLANT
NDL SAFETY ECLIPSE 18X1.5 (NEEDLE) IMPLANT
NEEDLE HYPO 18GX1.5 SHARP (NEEDLE) ×2
NEEDLE HYPO 22GX1.5 SAFETY (NEEDLE) ×8 IMPLANT
NEEDLE SAFETY 22GX1 (NEEDLE) ×2
PACK BASIN MAJOR ARMC (MISCELLANEOUS) ×4 IMPLANT
PAD DRESSING TELFA 3X8 NADH (GAUZE/BANDAGES/DRESSINGS) ×2 IMPLANT
PENCIL SMOKE ULTRAEVAC 22 CON (MISCELLANEOUS) ×2 IMPLANT
RETAINER VISCERA MED (MISCELLANEOUS) ×2 IMPLANT
SET CYSTO W/LG BORE CLAMP LF (SET/KITS/TRAYS/PACK) ×4 IMPLANT
SOL PREP PVP 2OZ (MISCELLANEOUS)
SOLUTION PREP PVP 2OZ (MISCELLANEOUS) ×2 IMPLANT
SPONGE LAP 18X18 RF (DISPOSABLE) ×4 IMPLANT
STAPLER INSORB 30 2030 C-SECTI (MISCELLANEOUS) ×2 IMPLANT
STAPLER SKIN PROX 35W (STAPLE) IMPLANT
SURGILUBE 2OZ TUBE FLIPTOP (MISCELLANEOUS) ×2 IMPLANT
SUT CHROMIC 2 0 CT 1 (SUTURE) ×2 IMPLANT
SUT PDS AB 1 TP1 96 (SUTURE) IMPLANT
SUT VIC AB 0 CT1 27 (SUTURE) ×10
SUT VIC AB 0 CT1 27XCR 8 STRN (SUTURE) ×4 IMPLANT
SUT VIC AB 0 CT1 36 (SUTURE) ×8 IMPLANT
SUT VIC AB 2-0 SH 27 (SUTURE) ×8
SUT VIC AB 2-0 SH 27XBRD (SUTURE) ×8 IMPLANT
SYR 10ML LL (SYRINGE) ×2 IMPLANT
SYR 20ML LL LF (SYRINGE) ×8 IMPLANT
SYR 30ML LL (SYRINGE) ×4 IMPLANT
SYR 3ML LL SCALE MARK (SYRINGE) ×2 IMPLANT
SYR BULB IRRIG 60ML STRL (SYRINGE) ×4 IMPLANT
TAPE CLOTH SURG 4X10 WHT LF (GAUZE/BANDAGES/DRESSINGS) ×2 IMPLANT
TRAY FOLEY MTR SLVR 16FR STAT (SET/KITS/TRAYS/PACK) ×4 IMPLANT
WATER STERILE IRR 1000ML POUR (IV SOLUTION) ×6 IMPLANT

## 2020-06-12 NOTE — Progress Notes (Signed)
Subjective: Patient reports pain ok on PCA Small sips  .    Objective: I have reviewed patient's vital signs, intake and output and labs.  hct 37% Good urine output  Assessment/Plan: Stable   LOS: 0 days  Repeat labs in am   Colleen Rogers 06/12/2020, 5:44 PM

## 2020-06-12 NOTE — Brief Op Note (Signed)
06/12/2020  10:54 AM  PATIENT:  Colleen Rogers  64 y.o. female  PRE-OPERATIVE DIAGNOSIS:  prolapse cervical fibroid and post menopausal bleeding  POST-OPERATIVE DIAGNOSIS:  prolapse cervical fibroid and post menopausal bleeding  PROCEDURE:  Procedure(s): HYSTERECTOMY ABDOMINAL WITH BILATERAL SALPINGO OOPHORECTOMY (Bilateral) CYSTOSCOPY (N/A)  SURGEON:  Surgeon(s) and Role:    * Valinda Fedie, Gwen Her, MD - Primary    * Benjaman Kindler, MD - Assisting  PHYSICIAN ASSISTANT:   ASSISTANTS: none   ANESTHESIA:   general  EBL:  950 mL IOF 1500 cc UO 485 cc  BLOOD ADMINISTERED:none  DRAINS: Urinary Catheter (Foley)   LOCAL MEDICATIONS USED:  MARCAINE    and BUPIVICAINE   SPECIMEN:  Source of Specimen:  cervux, uterus , bilateral fallopian tubes and ovaries   DISPOSITION OF SPECIMEN:  PATHOLOGY  COUNTS:  YES  TOURNIQUET:  * No tourniquets in log *  DICTATION: .Other Dictation: Dictation Number verbal  PLAN OF CARE: Admit to inpatient   PATIENT DISPOSITION:  PACU - hemodynamically stable.   Delay start of Pharmacological VTE agent (>24hrs) due to surgical blood loss or risk of bleeding: not applicable

## 2020-06-12 NOTE — Anesthesia Procedure Notes (Signed)
Procedure Name: Intubation Date/Time: 06/12/2020 7:42 AM Performed by: Lerry Liner, CRNA Pre-anesthesia Checklist: Patient identified, Emergency Drugs available, Suction available and Patient being monitored Patient Re-evaluated:Patient Re-evaluated prior to induction Oxygen Delivery Method: Circle system utilized Preoxygenation: Pre-oxygenation with 100% oxygen Induction Type: IV induction Ventilation: Mask ventilation without difficulty Laryngoscope Size: McGraph and 3 Grade View: Grade II Tube type: Oral Tube size: 6.5 mm Number of attempts: 1 Airway Equipment and Method: Stylet and Oral airway Placement Confirmation: ETT inserted through vocal cords under direct vision,  positive ETCO2 and breath sounds checked- equal and bilateral Secured at: 20 cm Tube secured with: Tape Dental Injury: Teeth and Oropharynx as per pre-operative assessment

## 2020-06-12 NOTE — Transfer of Care (Signed)
Immediate Anesthesia Transfer of Care Note  Patient: Colleen Rogers  Procedure(s) Performed: HYSTERECTOMY ABDOMINAL WITH BILATERAL SALPINGO OOPHORECTOMY (Bilateral ) CYSTOSCOPY (N/A )  Patient Location: PACU  Anesthesia Type:General  Level of Consciousness: awake, drowsy and patient cooperative  Airway & Oxygen Therapy: Patient Spontanous Breathing and Patient connected to face mask oxygen  Post-op Assessment: Report given to RN and Post -op Vital signs reviewed and stable  Post vital signs: Reviewed and stable  Last Vitals:  Vitals Value Taken Time  BP 109/63 06/12/20 1112  Temp    Pulse 44 06/12/20 1113  Resp 22 06/12/20 1113  SpO2 100 % 06/12/20 1113  Vitals shown include unvalidated device data.  Last Pain:  Vitals:   06/12/20 0636  TempSrc: Temporal  PainSc: 0-No pain         Complications: No complications documented.

## 2020-06-12 NOTE — Anesthesia Preprocedure Evaluation (Signed)
Anesthesia Evaluation  Patient identified by MRN, date of birth, ID band Patient awake    Reviewed: Allergy & Precautions, NPO status , Patient's Chart, lab work & pertinent test results  History of Anesthesia Complications Negative for: history of anesthetic complications  Airway Mallampati: II  TM Distance: >3 FB Neck ROM: Full    Dental no notable dental hx. (+) Teeth Intact   Pulmonary neg pulmonary ROS, neg sleep apnea, neg COPD, Patient abstained from smoking.Not current smoker,    Pulmonary exam normal breath sounds clear to auscultation       Cardiovascular Exercise Tolerance: Good METShypertension, (-) CAD and (-) Past MI (-) dysrhythmias  Rhythm:Regular Rate:Normal - Systolic murmurs    Neuro/Psych negative neurological ROS  negative psych ROS   GI/Hepatic neg GERD  ,(+)     (-) substance abuse  ,   Endo/Other  neg diabetesMorbid obesity  Renal/GU negative Renal ROS     Musculoskeletal   Abdominal (+) + obese,   Peds  Hematology   Anesthesia Other Findings Past Medical History: No date: Cerebral aneurysm without rupture No date: Hypertension  Reproductive/Obstetrics                             Anesthesia Physical Anesthesia Plan  ASA: III  Anesthesia Plan: General   Post-op Pain Management:    Induction: Intravenous  PONV Risk Score and Plan: 4 or greater and Ondansetron, Dexamethasone, Midazolam, Propofol infusion and TIVA  Airway Management Planned: Oral ETT  Additional Equipment: None  Intra-op Plan:   Post-operative Plan: Extubation in OR  Informed Consent: I have reviewed the patients History and Physical, chart, labs and discussed the procedure including the risks, benefits and alternatives for the proposed anesthesia with the patient or authorized representative who has indicated his/her understanding and acceptance.     Dental advisory given  Plan  Discussed with: CRNA and Surgeon  Anesthesia Plan Comments: (Discussed risks of anesthesia with patient, including PONV, sore throat, lip/dental damage. Rare risks discussed as well, such as cardiorespiratory and neurological sequelae. Patient understands.)        Anesthesia Quick Evaluation

## 2020-06-12 NOTE — Progress Notes (Signed)
   06/12/20 0802  Clinical Encounter Type  Visited With Family  Visit Type Initial  Referral From Chaplain  Consult/Referral To Chaplain  While rounding [facility] waiting area, chaplain briefly visited with Pt's husband to find out how he was doing and to see if he had any questions. Pt's husband said all was well and asked where the chapel was.

## 2020-06-12 NOTE — Progress Notes (Signed)
Pt is ready for TAH / BSO for PMB and prolapsed uterine fibroid. Labs reviewed . All question answered .  Proceed

## 2020-06-12 NOTE — Anesthesia Postprocedure Evaluation (Signed)
Anesthesia Post Note  Patient: Colleen Rogers  Procedure(s) Performed: HYSTERECTOMY ABDOMINAL WITH BILATERAL SALPINGO OOPHORECTOMY (Bilateral ) CYSTOSCOPY (N/A )  Patient location during evaluation: PACU Anesthesia Type: General Level of consciousness: awake and awake and alert Pain management: pain level controlled Vital Signs Assessment: post-procedure vital signs reviewed and stable Respiratory status: spontaneous breathing and nonlabored ventilation Cardiovascular status: blood pressure returned to baseline and stable Postop Assessment: no apparent nausea or vomiting Anesthetic complications: no   No complications documented.   Last Vitals:  Vitals:   06/12/20 1154 06/12/20 1157  BP:  102/71  Pulse: (!) 55 (!) 53  Resp: 16 14  Temp:    SpO2: 91% 90%    Last Pain:  Vitals:   06/12/20 1154  TempSrc:   PainSc: 6                  Neva Seat

## 2020-06-13 LAB — CBC
HCT: 32.1 % — ABNORMAL LOW (ref 36.0–46.0)
Hemoglobin: 10.5 g/dL — ABNORMAL LOW (ref 12.0–15.0)
MCH: 30.6 pg (ref 26.0–34.0)
MCHC: 32.7 g/dL (ref 30.0–36.0)
MCV: 93.6 fL (ref 80.0–100.0)
Platelets: 241 10*3/uL (ref 150–400)
RBC: 3.43 MIL/uL — ABNORMAL LOW (ref 3.87–5.11)
RDW: 13.4 % (ref 11.5–15.5)
WBC: 8.4 10*3/uL (ref 4.0–10.5)
nRBC: 0 % (ref 0.0–0.2)

## 2020-06-13 MED ORDER — OXYCODONE-ACETAMINOPHEN 5-325 MG PO TABS: 1.0000 | ORAL_TABLET | ORAL | Status: DC | PRN

## 2020-06-13 MED ORDER — ONDANSETRON 4 MG PO TBDP: 4.0000 mg | ORAL_TABLET | Freq: Four times a day (QID) | ORAL | Status: DC | PRN

## 2020-06-13 MED ORDER — SIMETHICONE 80 MG PO CHEW
80.0000 mg | CHEWABLE_TABLET | Freq: Four times a day (QID) | ORAL | Status: DC | PRN
  Administered 2020-06-13: 80 mg via ORAL

## 2020-06-13 MED ORDER — IBUPROFEN 600 MG PO TABS: 600.0000 mg | ORAL_TABLET | Freq: Four times a day (QID) | ORAL | Status: DC | PRN

## 2020-06-13 NOTE — Progress Notes (Signed)
1 Day Post-Op Procedure(s) (LRB): HYSTERECTOMY ABDOMINAL WITH BILATERAL SALPINGO OOPHORECTOMY (Bilateral) CYSTOSCOPY (N/A)  Subjective: Patient reports  Pain in good control now off PCA Drinking liquids well No orthostasis with standing .    Objective: I have reviewed patient's vital signs, intake and output, medications and labs.  General: alert and cooperative Resp: clear to auscultation bilaterally Cardio: regular rate and rhythm, S1, S2 normal, no murmur, click, rub or gallop GI: hypoactive BS . none distended  Incision covered  Assessment: s/p Procedure(s): HYSTERECTOMY ABDOMINAL WITH BILATERAL SALPINGO OOPHORECTOMY (Bilateral) CYSTOSCOPY (N/A): stable  Plan: d/c PCA and foley . continue clear liquids and she may request advance ment when ready   LOS: 1 day    Gwen Her Karizma Cheek 06/13/2020, 11:26 AM

## 2020-06-13 NOTE — Op Note (Signed)
NAME: Colleen Rogers, MENON MEDICAL RECORD DJ:[id number] ACCOUNT [id number] DATE OF BIRTH:[birth year] FACILITY: [facility] LOCATION: [facility] [place], [place]  OPERATIVE REPORT  DATE OF PROCEDURE:  06/12/2020  PREOPERATIVE DIAGNOSES: 1.  Postmenopausal bleeding. 2.  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  POSTOPERATIVE DIAGNOSES:  1.  Postmenopausal bleeding. 2.  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  PROCEDURE: 1.  Total abdominal hysterectomy with bilateral salpingo-oophorectomy. 2.  Cystoscopy.  ANESTHESIA:  General endotracheal anesthesia.  SURGEON:  Laverta Baltimore MD.  FIRST ASSISTANT:  Benjaman Kindler.  INDICATIONS:  A 64 year old female with a several month history of postmenopausal bleeding.  The patient is known to have an enlarged uterus and multiple fibroids and on exam, she was noted to have a 3 x 3 cm prolapsed uterine fibroid into the vaginal  vault.  FINDINGS:  16-week fibroid uterus with multiple fibroids.  The aforementioned prolapsed uterine fibroid was noted to be degenerating when the final specimen was removed from the body.  DESCRIPTION OF PROCEDURE:  After adequate general endotracheal anesthesia, the patient was placed in dorsal supine position, legs placed in the [place].  The patient's abdomen, perineum and vagina were prepped and draped in normal sterile fashion.   Foley catheter had been placed.  A timeout was performed.  The patient did receive 3 grams of IV Ancef for surgical prophylaxis.  A Pfannenstiel incision was made 2 fingerbreadths above the symphysis pubis.  Sharp dissection was used to identify the  fascia.  The fascia was opened in the midline in a transverse fashion.  Superior aspect of the fascia was grasped with Kocher clamps and the recti muscles were dissected free.  Inferior aspect of the fascia was grasped with Kocher clamps and the  pyramidalis muscle was dissected free.  Entry into  the peritoneal cavity was accomplished sharply.  The O'Connor-O'Sullivan retractor was then brought up to the operative field, and the bowel was packed cephalad.  Large 16-week uterus with multiple  fibroids was noted.  The uterus was elevated through the abdominal incision given limited lateral spacing.  The round ligaments on each side were doubly ligated, transected.  Broad ligament was opened and the utero-ovarian ligament was doubly clamped,  transected and suture ligated with 0 Vicryl suture.  Ovaries were left in situ initially given the limited space to get to the infundibulopelvic ligaments.  The sequential clamping and suturing along the lateral sides of the uterus to the level of the  isthmus.  At this point, the uterus was amputated above the secured blood supply, although some brisk bleeding was encountered during the procedure with the amputation.  Ultimately, the uterus was amputated and delivered off the operative field, and the  bladder flap was then created and the inferior portion of the uterine arteries were bilaterally clamped, transected, suture ligated with 0 Vicryl suture.  Large 5 cm left cervical fibroid was encountered and ultimately the cardinal ligaments were  bilaterally clamped, transected, suture ligated with 0 Vicryl suture.  The vaginal angles were then clamped with Heaney ballantine clamps and the cervical stump with the degenerated uterine fibroid was then delivered into the abdominal cavity and then  passed off the operative field.  At this point, the infundibulopelvic ligaments were bilaterally clamped and each fallopian tube and ovary was removed and each pedicle was doubly ligated.  The vaginal cuff was then closed with interrupted 0 Vicryl suture  with good hemostasis noted.  A cystoscopy was then performed at this point with the  30-degree scope.  LR was used as distending medium and the ureteral ostia were identified bilaterally with normal urine efflux.  The  cystoscope was removed and the Foley  catheter was replaced.  Gloves were changed, gown was changed and attention was directed back up to the patient's abdomen.  The patient's abdomen was copiously irrigated.  Good hemostasis was noted.  All pedicles were then cut and the fascia was then  closed with a running 0 Vicryl suture, 2 separate sutures were used.  Fascial edges were then injected with Exparel solution of 20 mL of 1.3% Exparel plus 30 mL of 0.5% Marcaine plus 50 mL normal saline, 50 mL of this solution was injected.  Subcutaneous  tissues were irrigated and bovied for hemostasis and given the depth of the subcutaneous tissues of 6 cm, the subcutaneous dead space was closed with a 2-0 chromic suture and the skin was reapproximated with Insorb absorbable staples.  Good cosmetic  effect.  COMPLICATIONS:  There were no complications.  ESTIMATED BLOOD LOSS:  950 mL.  URINE OUTPUT:  480 mL.  INTRAOPERATIVE FLUIDS:  1500 mL.  DISPOSITION:  The patient tolerated the procedure well and was taken to recovery room in good condition.  IN/NUANCE  D:06/12/2020 T:06/13/2020 JOB:[id number]

## 2020-06-13 NOTE — Progress Notes (Signed)
PHARMACIST - PHYSICIAN COMMUNICATION  CONCERNING:  Enoxaparin (Lovenox) for DVT Prophylaxis    RECOMMENDATION: Patient was prescribed enoxaprin 40mg  q24 hours for VTE prophylaxis.   Filed Weights   06/12/20 0557  Weight: 121.1 kg (266 lb 15.6 oz)    Body mass index is 41.81 kg/m.  Estimated Creatinine Clearance: 95.8 mL/min (by C-G formula based on SCr of 0.66 mg/dL).   Based on [place] patient is candidate for enoxaparin 0.5mg /kg TBW SQ every 24 hours based on BMI being >30.  DESCRIPTION: Pharmacy has adjusted enoxaparin dose per [facility] policy.  Patient is now receiving enoxaparin 60 mg every 24 hours    Berta Minor, PharmD Clinical Pharmacist  06/13/2020 11:27 AM

## 2020-06-14 MED ORDER — OXYCODONE-ACETAMINOPHEN 5-325 MG PO TABS
1.0000 | ORAL_TABLET | Freq: Four times a day (QID) | ORAL | 0 refills | Status: AC | PRN
Start: 2020-06-14 — End: 2021-06-14

## 2020-06-14 MED ORDER — ONDANSETRON HCL 4 MG PO TABS: 4.0000 mg | ORAL_TABLET | Freq: Every day | ORAL | 1 refills | Status: AC | PRN

## 2020-06-14 MED ORDER — GABAPENTIN 300 MG PO CAPS: 300.0000 mg | ORAL_CAPSULE | Freq: Three times a day (TID) | ORAL | 2 refills | Status: AC

## 2020-06-14 MED ORDER — IBUPROFEN 800 MG PO TABS: 800.0000 mg | ORAL_TABLET | Freq: Three times a day (TID) | ORAL | 0 refills | Status: AC | PRN

## 2020-06-14 NOTE — Discharge Instructions (Signed)
Abdominal Hysterectomy, Care After This sheet gives you information about how to care for yourself after your procedure. Your health care provider may also give you more specific instructions. If you have problems or questions, contact your health care provider. What can I expect after the procedure? After your procedure, it is common to have:  Pain.  Fatigue.  Poor appetite.  Less interest in sex.  Vaginal bleeding and discharge. You may need to use a sanitary napkin after this procedure. Follow these instructions at home: Bathing  Do not take baths, swim, or use a hot tub until your health care provider approves. Ask your health care provider if you can take showers. You may only be allowed to take sponge baths for bathing.  Keep the bandage (dressing) dry until your health care provider says it can be removed. Incision care   Follow instructions from your health care provider about how to take care of your incision. Make sure you: ? Wash your hands with soap and water before you change your bandage (dressing). If soap and water are not available, use hand sanitizer. ? Change your dressing as told by your health care provider. ? Leave stitches (sutures), skin glue, or adhesive strips in place. These skin closures may need to stay in place for 2 weeks or longer. If adhesive strip edges start to loosen and curl up, you may trim the loose edges. Do not remove adhesive strips completely unless your health care provider tells you to do that.  Check your incision area every day for signs of infection. Check for: ? Redness, swelling, or pain. ? Fluid or blood. ? Warmth. ? Pus or a bad smell. Activity  Do gentle, daily exercises as told by your health care provider. You may be told to take short walks every day and go farther each time.  Do not lift anything that is heavier than 10 lb (4.5 kg), or the limit that your health care provider tells you, until he or she says that it is  safe.  Do not drive or use heavy machinery while taking prescription pain medicine.  Do not drive for 24 hours if you were given a medicine to help you relax (sedative).  Follow your health care provider's instructions about exercise, driving, and general activities. Ask your health care provider what activities are safe for you. Lifestyle  Do not douche, use tampons, or have sex for at least 6 weeks or as told by your health care provider.  Do not drink alcohol until your health care provider approves.  Drink enough fluid to keep your urine clear or pale yellow.  Try to have someone at home with you for the first 1-2 weeks to help.  Do not use any products that contain nicotine or tobacco, such as cigarettes and e-cigarettes. These can delay healing. If you need help quitting, ask your health care provider. General instructions  Take over-the-counter and prescription medicines only as told by your health care provider.  Do not take aspirin or ibuprofen. These medicines can cause bleeding.  To prevent or treat constipation while you are taking prescription pain medicine, your health care provider may recommend that you: ? Drink enough fluid to keep your urine clear or pale yellow. ? Take over-the-counter or prescription medicines. ? Eat foods that are high in fiber, such as fresh fruits and vegetables, whole grains, and beans. ? Limit foods that are high in fat and processed sugars, such as fried and sweet foods.  Keep all  follow-up visits as told by your health care provider. This is important. Contact a health care provider if:  You have chills or fever.  You have redness, swelling, or pain around your incision.  You have fluid or blood coming from your incision.  Your incision feels warm to the touch.  You have pus or a bad smell coming from your incision.  Your incision breaks open.  You feel dizzy or light-headed.  You have pain or bleeding when you urinate.  You  have persistent diarrhea.  You have persistent nausea and vomiting.  You have abnormal vaginal discharge.  You have a rash.  You have any type of abnormal reaction or you develop an allergy to your medicine.  Your pain medicine does not help. Get help right away if:  You have a fever and your symptoms suddenly get worse.  You have severe abdominal pain.  You have shortness of breath.  You faint.  You have pain, swelling, or redness in your leg.  You have heavy vaginal bleeding with blood clots. Summary  After your procedure, it is common to have pain, fatigue and vaginal discharge.  Do not take baths, swim, or use a hot tub until your health care provider approves. Ask your health care provider if you can take showers. You may only be allowed to take sponge baths for bathing.  Follow your health care provider's instructions about exercise, driving, and general activities. Ask your health care provider what activities are safe for you.  Do not lift anything that is heavier than 10 lb (4.5 kg), or the limit that your health care provider tells you, until he or she says that it is safe.  Try to have someone at home with you for the first 1-2 weeks to help. This information is not intended to replace advice given to you by your health care provider. Make sure you discuss any questions you have with your health care provider. Document Revised: 08/21/2018 Document Reviewed: 07/06/2016 Elsevier Patient Education  [place].

## 2020-06-14 NOTE — Discharge Summary (Signed)
Physician Discharge Summary  Patient ID: Colleen Rogers MRN: [id number] DOB/AGE: 1956-04-01 64 y.o.  Admit date: 06/12/2020 Discharge date: 06/14/2020  Admission Diagnoses:PMB and prolapse uterine fibroid  Discharge Diagnoses:  Active Problems:   Post-operative state   Discharged Condition: good  Hospital Course: she underwent an uncomplicated TAH / BSO . EBL 950 cc . POD#1 HCT dropped but pt remained asymptomatic . POD#1 tolerated solid food . + Flatus before d/c . Pain in good control   Consults: None  Significant Diagnostic Studies: labs:  Results for orders placed or performed during the hospital encounter of 06/12/20 (from the past 72 hour(s))  ABO/Rh     Status: None   Collection Time: 06/12/20  6:48 AM  Result Value Ref Range   ABO/RH(D)      O POS Performed at [facility], [address] [place], [place] [address]   CBC     Status: Abnormal   Collection Time: 06/12/20 11:23 AM  Result Value Ref Range   WBC 12.0 (H) 4.0 - 10.5 K/uL   RBC 3.82 (L) 3.87 - 5.11 MIL/uL   Hemoglobin 11.5 (L) 12.0 - 15.0 g/dL   HCT 37.1 36 - 46 %   MCV 97.1 80.0 - 100.0 fL   MCH 30.1 26.0 - 34.0 pg   MCHC 31.0 30.0 - 36.0 g/dL   RDW 13.3 11.5 - 15.5 %   Platelets 266 150 - 400 K/uL   nRBC 0.0 0.0 - 0.2 %    Comment: Performed at [facility], [address] [place], [place] [address]  Basic metabolic panel     Status: Abnormal   Collection Time: 06/12/20 11:23 AM  Result Value Ref Range   Sodium 139 135 - 145 mmol/L   Potassium 3.7 3.5 - 5.1 mmol/L   Chloride 106 98 - 111 mmol/L   CO2 27 22 - 32 mmol/L   Glucose, Bld 144 (H) 70 - 99 mg/dL    Comment: Glucose reference range applies only to samples taken after fasting for at least 8 hours.   BUN 13 8 - 23 mg/dL   Creatinine, Ser 0.66 0.44 - 1.00 mg/dL   Calcium 8.8 (L) 8.9 - 10.3 mg/dL   GFR, Estimated >60 >60 mL/min    Comment: (NOTE) Calculated using the CKD-EPI Creatinine Equation (2021)    Anion  gap 6 5 - 15    Comment: Performed at [facility], [address] [place], [place] [address]  CBC     Status: Abnormal   Collection Time: 06/13/20  6:23 AM  Result Value Ref Range   WBC 8.4 4.0 - 10.5 K/uL   RBC 3.43 (L) 3.87 - 5.11 MIL/uL   Hemoglobin 10.5 (L) 12.0 - 15.0 g/dL   HCT 32.1 (L) 36 - 46 %   MCV 93.6 80.0 - 100.0 fL   MCH 30.6 26.0 - 34.0 pg   MCHC 32.7 30.0 - 36.0 g/dL   RDW 13.4 11.5 - 15.5 %   Platelets 241 150 - 400 K/uL   nRBC 0.0 0.0 - 0.2 %    Comment: Performed at [facility], [address] [place], [place] [address]    Treatments: surgery: as above   Discharge Exam: Blood pressure 122/84, pulse 75, temperature 98.5 F (36.9 C), temperature source Oral, resp. rate 18, height 5\' 7"  (1.702 m), weight 121.1 kg, SpO2 97 %. General appearance: alert and cooperative Resp: clear to auscultation bilaterally Cardio: regular rate and rhythm, S1, S2  normal, no murmur, click, rub or gallop GI: soft, non-tender; bowel sounds normal; no masses,  no organomegaly Skin: Skin color, texture, turgor normal. No rashes or lesions, incision C/D/I   Disposition: Discharge disposition: 01-Home or Self Care       Discharge Instructions     Remove dressing in 72 hours   Complete by: As directed    Call MD for:   Complete by: As directed    Call MD for:  difficulty breathing, headache or visual disturbances   Complete by: As directed    Call MD for:  extreme fatigue   Complete by: As directed    Call MD for:  hives   Complete by: As directed    Call MD for:  persistant dizziness or light-headedness   Complete by: As directed    Call MD for:  persistant nausea and vomiting   Complete by: As directed    Call MD for:  redness, tenderness, or signs of infection (pain, swelling, redness, odor or green/yellow discharge around incision site)   Complete by: As directed    Call MD for:  severe uncontrolled pain   Complete by: As directed    Call MD  for:  temperature >100.4   Complete by: As directed    Diet - low sodium heart healthy   Complete by: As directed    Diet general   Complete by: As directed    Increase activity slowly   Complete by: As directed      Allergies as of 06/14/2020      Reactions   Clindamycin Nausea And Vomiting      Medication List    TAKE these medications   amLODipine 2.5 MG tablet Commonly known as: NORVASC Take 2.5 mg by mouth daily.   aspirin EC 81 MG tablet Take 81 mg by mouth daily.   gabapentin 300 MG capsule Commonly known as: Neurontin Take 1 capsule (300 mg total) by mouth 3 (three) times daily.   hydrochlorothiazide 25 MG tablet Commonly known as: HYDRODIURIL Take 25 mg by mouth daily.   ibuprofen 800 MG tablet Commonly known as: ADVIL Take 1 tablet (800 mg total) by mouth every 8 (eight) hours as needed for moderate pain.   ondansetron 4 MG tablet Commonly known as: Zofran Take 1 tablet (4 mg total) by mouth daily as needed for nausea or vomiting.   oxyCODONE-acetaminophen 5-325 MG tablet Commonly known as: Percocet Take 1 tablet by mouth every 6 (six) hours as needed for moderate pain or severe pain.       Follow-up Information    Emiyah Spraggins, Gwen Her, MD Follow up in 2 week(s).   Specialty: Obstetrics and Gynecology Why: post op  Contact information: [address] [place] [address] [phone]               Signed: Gwen Her [place] 06/14/2020, 11:15 AM

## 2020-06-14 NOTE — Progress Notes (Signed)
Pt discharged home.  Discharge instructions, prescriptions and follow up appointment given to and reviewed with pt.  Pt verbalized understanding.  Escorted by auxillary. 

## 2020-06-16 LAB — SURGICAL PATHOLOGY

## 2021-02-03 ENCOUNTER — Other Ambulatory Visit: Payer: Self-pay | Admitting: Obstetrics and Gynecology

## 2021-02-03 DIAGNOSIS — Z1231 Encounter for screening mammogram for malignant neoplasm of breast: Secondary | ICD-10-CM

## 2021-02-23 ENCOUNTER — Ambulatory Visit: Payer: Managed Care, Other (non HMO)

## 2021-03-15 ENCOUNTER — Ambulatory Visit
Admission: RE | Admit: 2021-03-15 | Discharge: 2021-03-15 | Disposition: A | Discharge status: Home / Self Care | Payer: Medicare Other | Source: Ambulatory Visit | Attending: Obstetrics and Gynecology | Admitting: Obstetrics and Gynecology

## 2021-03-15 ENCOUNTER — Other Ambulatory Visit: Payer: Self-pay

## 2021-03-15 DIAGNOSIS — Z1231 Encounter for screening mammogram for malignant neoplasm of breast: Secondary | ICD-10-CM | POA: Insufficient documentation

## 2021-08-26 IMAGING — MG DIGITAL SCREENING BILAT W/ TOMO W/ CAD
8 series · 8 of 24 positions shown · non-contrast
Comparison: Previous exam(s).

CLINICAL DATA: Screening.

EXAM:
DIGITAL SCREENING BILATERAL MAMMOGRAM WITH TOMO AND CAD

[R MLO synth-2D]
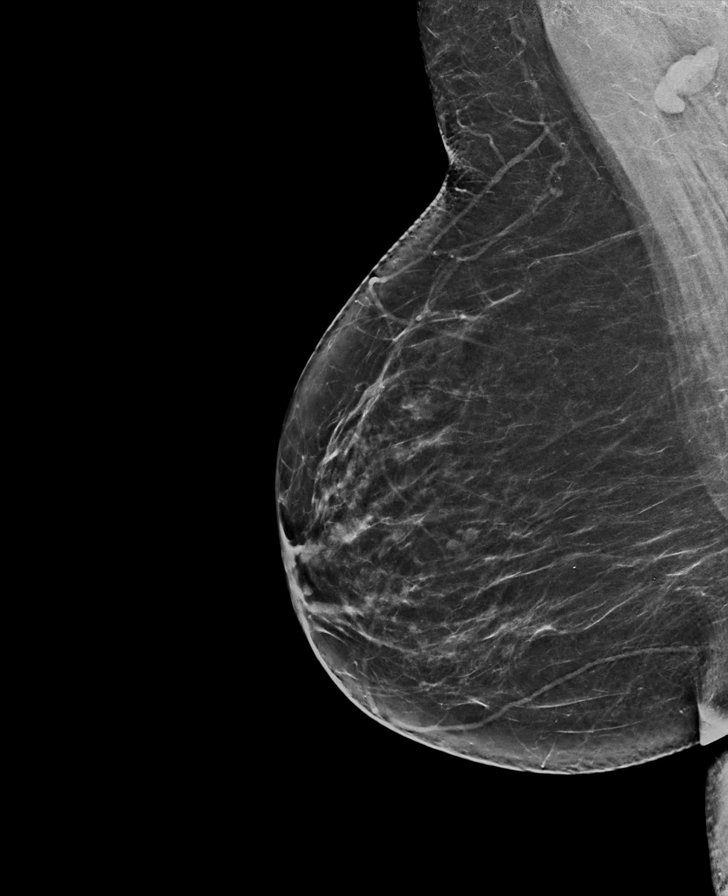

[R CC synth-2D]
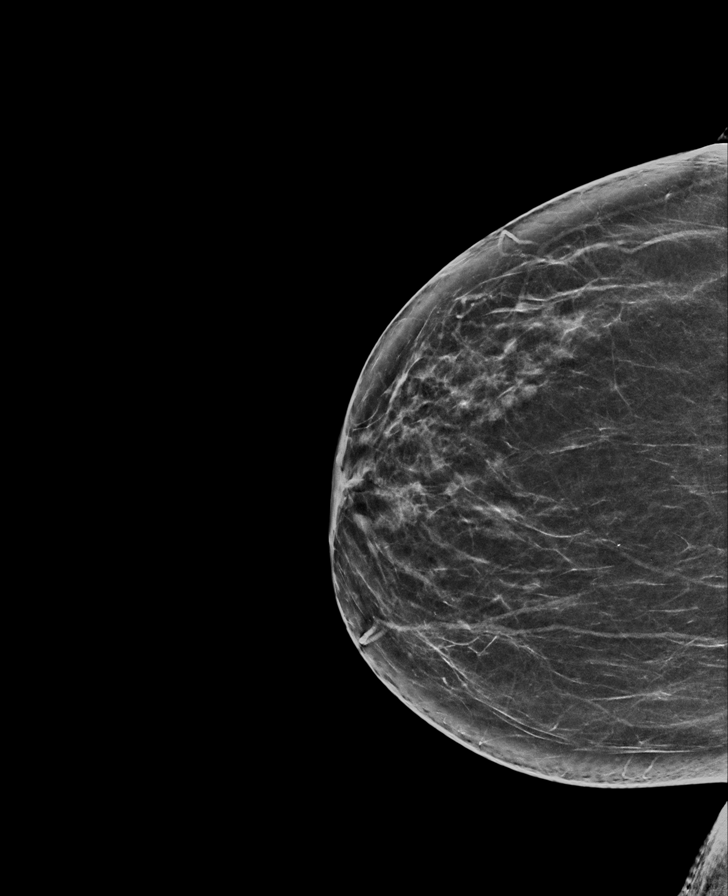

[L MLO synth-2D]
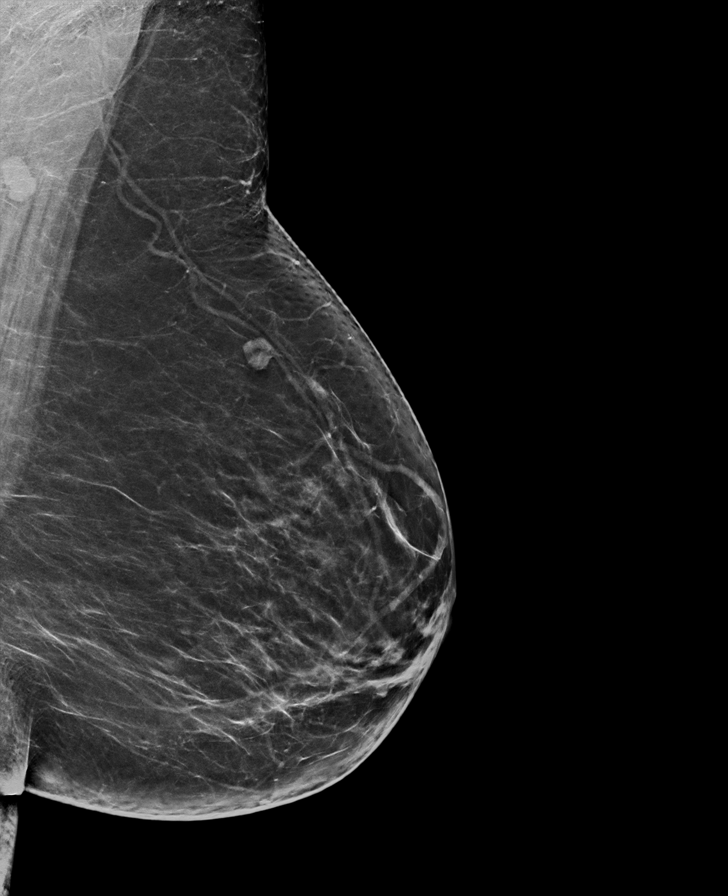

[L CC synth-2D]
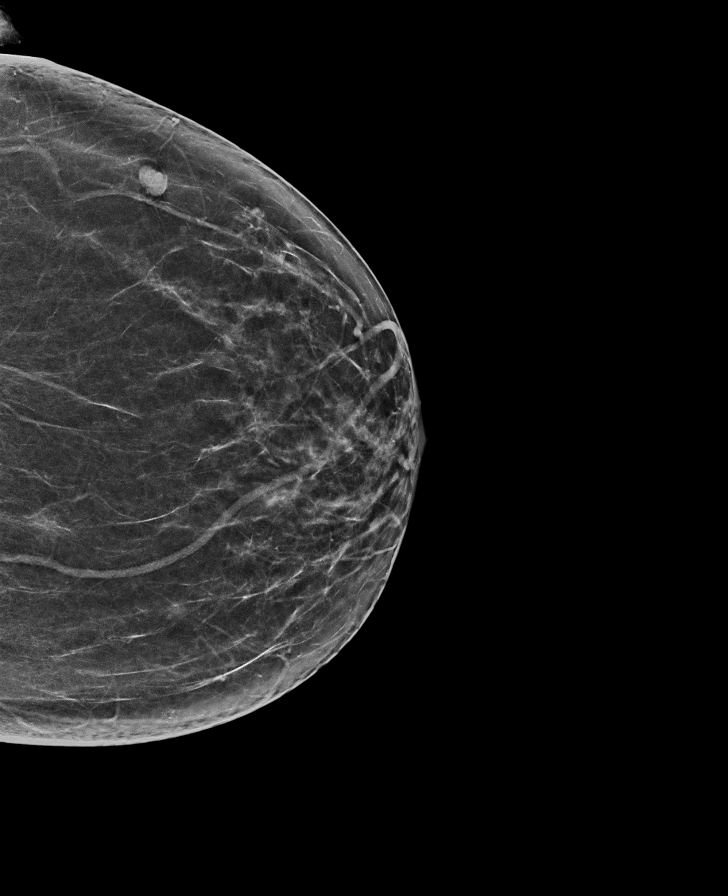

[R MLO tomo · tomo slice 39/77.0]
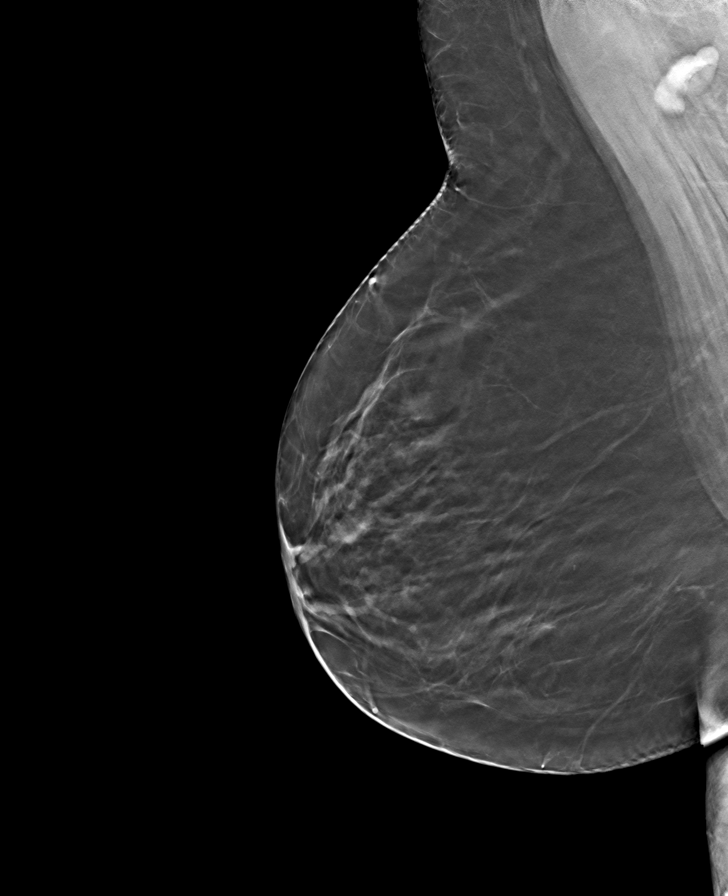

[R CC tomo · tomo slice 35/70.0]
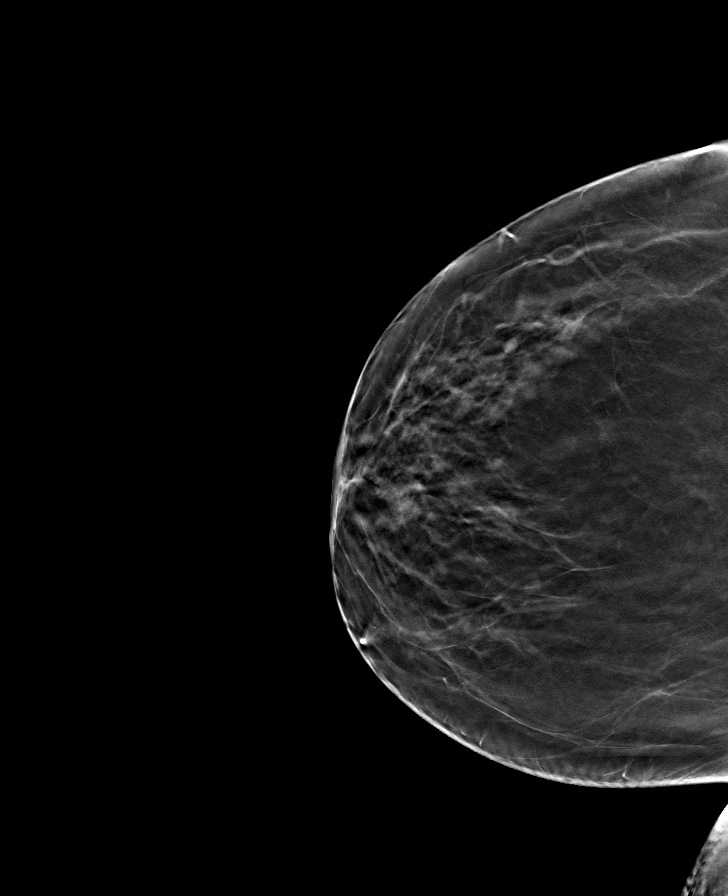

[L MLO tomo · tomo slice 39/78.0]
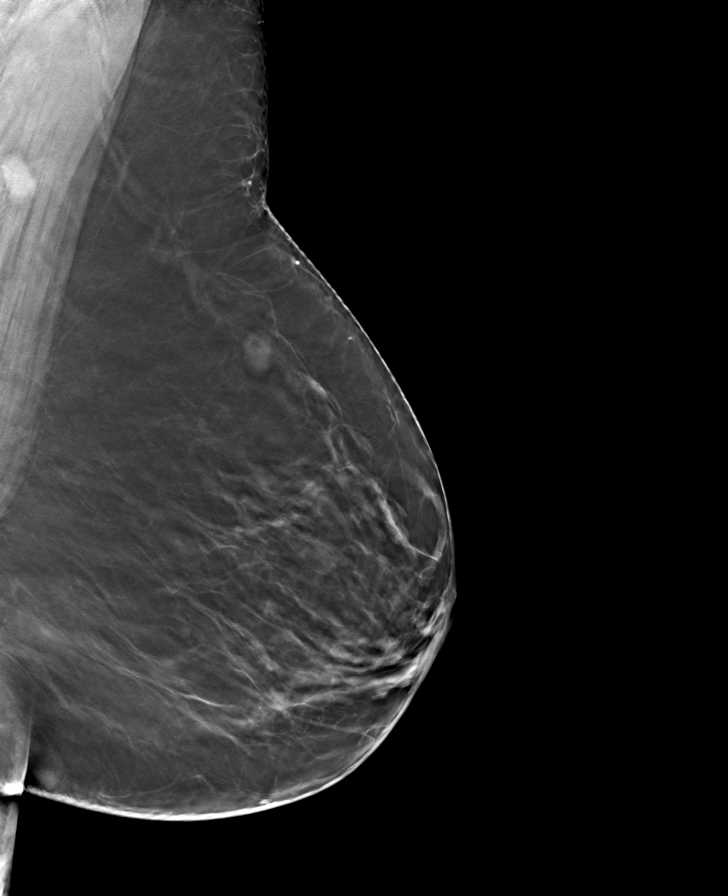

[L CC tomo · tomo slice 35/68.0]
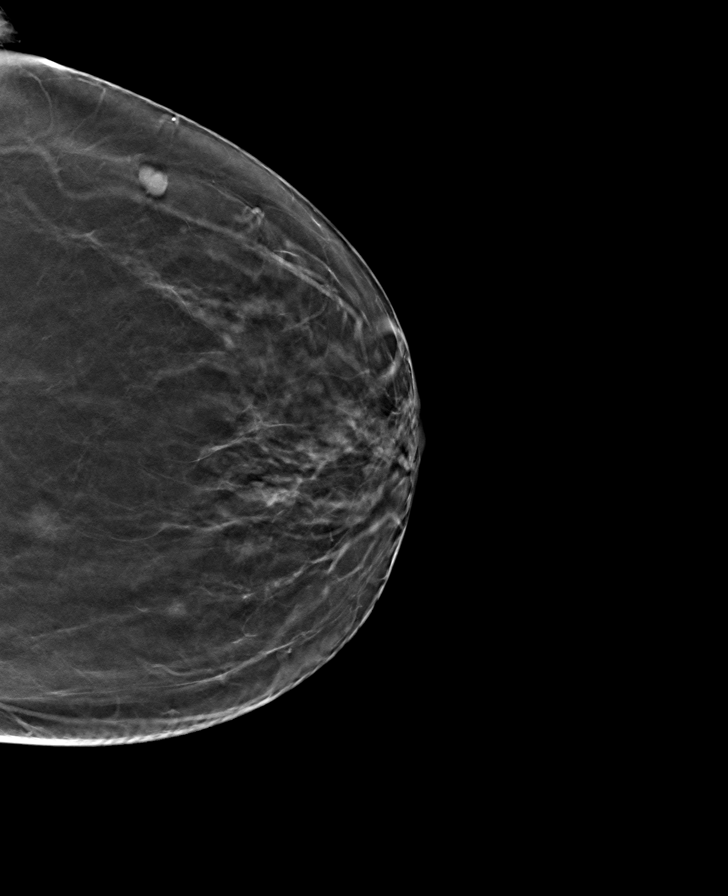

[8 of 24 positions shown; findings below may reference images not displayed]

ACR Breast Density Category b: There are scattered areas of
fibroglandular density.
FINDINGS: There are no findings suspicious for malignancy. Images were
processed with CAD.
IMPRESSION: No mammographic evidence of malignancy. A result letter of this
screening mammogram will be mailed directly to the patient.

RECOMMENDATION:
Screening mammogram in one year. (Code:[id number])

BI-RADS CATEGORY  1: Negative.

## 2022-03-14 ENCOUNTER — Other Ambulatory Visit: Payer: Self-pay | Admitting: Obstetrics and Gynecology

## 2022-05-30 ENCOUNTER — Encounter (INDEPENDENT_AMBULATORY_CARE_PROVIDER_SITE_OTHER): Payer: Self-pay

## 2022-07-18 ENCOUNTER — Ambulatory Visit: Payer: Medicare Other | Admitting: Certified Registered"

## 2022-07-18 ENCOUNTER — Other Ambulatory Visit: Payer: Self-pay

## 2022-07-18 ENCOUNTER — Encounter: Payer: Self-pay | Admitting: *Deleted

## 2022-07-18 ENCOUNTER — Ambulatory Visit
Admission: RE | Admit: 2022-07-18 | Discharge: 2022-07-18 | Disposition: A | Discharge status: Home / Self Care | Payer: Medicare Other | Attending: Gastroenterology | Admitting: Gastroenterology

## 2022-07-18 ENCOUNTER — Encounter
Admission: RE | Disposition: A | Discharge status: Home / Self Care | Payer: Self-pay | Source: Home / Self Care | Attending: *Deleted

## 2022-07-18 DIAGNOSIS — I10 Essential (primary) hypertension: Secondary | ICD-10-CM | POA: Insufficient documentation

## 2022-07-18 DIAGNOSIS — Z1211 Encounter for screening for malignant neoplasm of colon: Secondary | ICD-10-CM | POA: Diagnosis present

## 2022-07-18 DIAGNOSIS — Z6841 Body Mass Index (BMI) 40.0 and over, adult: Secondary | ICD-10-CM | POA: Insufficient documentation

## 2022-07-18 DIAGNOSIS — Z90710 Acquired absence of both cervix and uterus: Secondary | ICD-10-CM | POA: Diagnosis not present

## 2022-07-18 DIAGNOSIS — K640 First degree hemorrhoids: Secondary | ICD-10-CM | POA: Diagnosis not present

## 2022-07-18 DIAGNOSIS — K5730 Diverticulosis of large intestine without perforation or abscess without bleeding: Secondary | ICD-10-CM | POA: Insufficient documentation

## 2022-07-18 HISTORY — PX: COLONOSCOPY WITH PROPOFOL: SHX5780

## 2022-07-18 SURGERY — COLONOSCOPY WITH PROPOFOL
Anesthesia: General

## 2022-07-18 MED ORDER — LIDOCAINE HCL (CARDIAC) PF 100 MG/5ML IV SOSY
PREFILLED_SYRINGE | INTRAVENOUS | Status: DC | PRN
  Administered 2022-07-18: 100 mg via INTRAVENOUS

## 2022-07-18 MED ORDER — SODIUM CHLORIDE 0.9 % IV SOLN: INTRAVENOUS | Status: DC

## 2022-07-18 MED ORDER — PROPOFOL 10 MG/ML IV BOLUS
INTRAVENOUS | Status: DC | PRN
  Administered 2022-07-18: 110 mg via INTRAVENOUS
  Administered 2022-07-18: 100 ug/kg/min via INTRAVENOUS

## 2022-07-18 NOTE — Interval H&P Note (Signed)
History and Physical Interval Note:  07/18/2022 1:29 PM  Colleen Rogers  has presented today for surgery, with the diagnosis of CCA SCREEN.  The various methods of treatment have been discussed with the patient and family. After consideration of risks, benefits and other options for treatment, the patient has consented to  Procedure(s): COLONOSCOPY WITH PROPOFOL (N/A) as a surgical intervention.  The patient's history has been reviewed, patient examined, no change in status, stable for surgery.  I have reviewed the patient's chart and labs.  Questions were answered to the patient's satisfaction.     Lesly Rubenstein  Ok to proceed with colonoscopy

## 2022-07-18 NOTE — Op Note (Signed)
[facility] Patient Name: Colleen Rogers Procedure Date: 07/18/2022 1:26 PM MRN: [id number] Account #: [id number] Date of Birth: [birth year] Admit Type: Outpatient Age: 66 Room: [facility] ENDO ROOM 3 Gender: Female Note Status: Finalized Instrument Name: Jasper Riling [id number] Procedure:             Colonoscopy Indications:           Screening for colorectal malignant neoplasm Providers:             Andrey Farmer MD, MD Referring MD:          No Local Md, MD (Referring MD) Medicines:             Monitored Anesthesia Care Complications:         No immediate complications. Estimated blood loss:                         Minimal. Procedure:             Pre-Anesthesia Assessment:                        - Prior to the procedure, a History and Physical was                         performed, and patient medications and allergies were                         reviewed. The patient is competent. The risks and                         benefits of the procedure and the sedation options and                         risks were discussed with the patient. All questions                         were answered and informed consent was obtained.                         Patient identification and proposed procedure were                         verified by the physician, the nurse, the                         anesthesiologist, the anesthetist and the technician                         in the endoscopy suite. Mental Status Examination:                         alert and oriented. Airway Examination: normal                         oropharyngeal airway and neck mobility. Respiratory                         Examination: clear to auscultation. CV Examination:  normal. Prophylactic Antibiotics: The patient does not                         require prophylactic antibiotics. Prior                         Anticoagulants: The patient has taken no anticoagulant                          or antiplatelet agents. ASA Grade Assessment: III - A                         patient with severe systemic disease. After reviewing                         the risks and benefits, the patient was deemed in                         satisfactory condition to undergo the procedure. The                         anesthesia plan was to use monitored anesthesia care                         (MAC). Immediately prior to administration of                         medications, the patient was re-assessed for adequacy                         to receive sedatives. The heart rate, respiratory                         rate, oxygen saturations, blood pressure, adequacy of                         pulmonary ventilation, and response to care were                         monitored throughout the procedure. The physical                         status of the patient was re-assessed after the                         procedure.                        After obtaining informed consent, the colonoscope was                         passed under direct vision. Throughout the procedure,                         the patient's blood pressure, pulse, and oxygen                         saturations were monitored continuously. The  Colonoscope was introduced through the anus and                         advanced to the the cecum, identified by appendiceal                         orifice and ileocecal valve. The colonoscopy was                         performed without difficulty. The patient tolerated                         the procedure well. The quality of the bowel                         preparation was good. The ileocecal valve, appendiceal                         orifice, and rectum were photographed. Findings:      The perianal and digital rectal examinations were normal.      A few small-mouthed diverticula were found in the sigmoid colon.      Internal hemorrhoids were found during  retroflexion. The hemorrhoids       were Grade I (internal hemorrhoids that do not prolapse).      The exam was otherwise without abnormality on direct and retroflexion       views. Impression:            - Diverticulosis in the sigmoid colon.                        - Internal hemorrhoids.                        - The examination was otherwise normal on direct and                         retroflexion views.                        - No specimens collected. Recommendation:        - Discharge patient to home.                        - Resume previous diet.                        - Continue present medications.                        - Repeat colonoscopy in 10 years for screening                         purposes.                        - Return to referring physician as previously                         scheduled. Procedure Code(s):     --- Professional ---  R4270, Colorectal cancer screening; colonoscopy on                         individual not meeting criteria for high risk Diagnosis Code(s):     --- Professional ---                        Z12.11, Encounter for screening for malignant neoplasm                         of colon                        K64.0, First degree hemorrhoids                        K57.30, Diverticulosis of large intestine without                         perforation or abscess without bleeding CPT copyright 2022 [employer]. All rights reserved. The codes documented in this report are preliminary and upon coder review may  be revised to meet current compliance requirements. Andrey Farmer MD, MD 07/18/2022 1:55:42 PM Number of Addenda: 0 Note Initiated On: 07/18/2022 1:26 PM Scope Withdrawal Time: 0 hours 10 minutes 1 second  Total Procedure Duration: 0 hours 15 minutes 51 seconds  Estimated Blood Loss:  Estimated blood loss was minimal.      [facility]

## 2022-07-18 NOTE — H&P (Signed)
Outpatient short stay form Pre-procedure 07/18/2022  Colleen Rubenstein, MD  Primary Physician: Barbaraann Boys, MD  Reason for visit:  Screening  History of present illness:    66 y/o lady with history of hypertension and obesity here for screening colonoscopy. Last colonoscopy 10 years ago. History of hysterectomy. No blood thinners. No family history of GI malignancies.    Current Facility-Administered Medications:    0.9 %  sodium chloride infusion, , Intravenous, Continuous, Ivey Cina, Hilton Cork, MD, Last Rate: 20 mL/hr at 07/18/22 1308, New Bag at 07/18/22 1308  Medications Prior to Admission  Medication Sig Dispense Refill Last Dose   amLODipine (NORVASC) 2.5 MG tablet Take 2.5 mg by mouth daily.  1 Past Week   aspirin EC 81 MG tablet Take 81 mg by mouth daily.    Past Week   hydrochlorothiazide (HYDRODIURIL) 25 MG tablet Take 25 mg by mouth daily.   1 Past Week   ibuprofen (ADVIL) 800 MG tablet Take 1 tablet (800 mg total) by mouth every 8 (eight) hours as needed for moderate pain. 30 tablet 0 Past Week   gabapentin (NEURONTIN) 300 MG capsule Take 1 capsule (300 mg total) by mouth 3 (three) times daily. 10 capsule 2      Allergies  Allergen Reactions   Clindamycin Nausea And Vomiting     Past Medical History:  Diagnosis Date   Cerebral aneurysm without rupture    Hypertension     Review of systems:  Otherwise negative.    Physical Exam  Gen: Alert, oriented. Appears stated age.  HEENT: PERRLA. Lungs: No respiratory distress CV: RRR Abd: soft, benign, no masses Ext: No edema    Planned procedures: Proceed with colonoscopy. The patient understands the nature of the planned procedure, indications, risks, alternatives and potential complications including but not limited to bleeding, infection, perforation, damage to internal organs and possible oversedation/side effects from anesthesia. The patient agrees and gives consent to proceed.  Please refer to  procedure notes for findings, recommendations and patient disposition/instructions.     Colleen Rubenstein, MD [facility]

## 2022-07-18 NOTE — Anesthesia Preprocedure Evaluation (Signed)
Anesthesia Evaluation  Patient identified by MRN, date of birth, ID band Patient awake    Reviewed: Allergy & Precautions, NPO status , Patient's Chart, lab work & pertinent test results  History of Anesthesia Complications Negative for: history of anesthetic complications  Airway Mallampati: II  TM Distance: >3 FB Neck ROM: Full    Dental no notable dental hx. (+) Teeth Intact, Dental Advidsory Given   Pulmonary neg pulmonary ROS, neg shortness of breath, neg sleep apnea, neg COPD, neg recent URI, Patient abstained from smoking.Not current smoker   Pulmonary exam normal breath sounds clear to auscultation       Cardiovascular Exercise Tolerance: Good METShypertension, (-) angina (-) CAD and (-) Past MI (-) dysrhythmias  Rhythm:Regular Rate:Normal - Systolic murmurs    Neuro/Psych negative neurological ROS  negative psych ROS   GI/Hepatic ,neg GERD  ,,(+)     (-) substance abuse    Endo/Other  neg diabetes  Morbid obesity  Renal/GU negative Renal ROS     Musculoskeletal   Abdominal  (+) + obese  Peds  Hematology   Anesthesia Other Findings Past Medical History: No date: Cerebral aneurysm without rupture No date: Hypertension  Reproductive/Obstetrics                             Anesthesia Physical Anesthesia Plan  ASA: 3  Anesthesia Plan: General   Post-op Pain Management:    Induction: Intravenous  PONV Risk Score and Plan: 4 or greater and Propofol infusion and TIVA  Airway Management Planned: Natural Airway and Nasal Cannula  Additional Equipment: None  Intra-op Plan:   Post-operative Plan:   Informed Consent: I have reviewed the patients History and Physical, chart, labs and discussed the procedure including the risks, benefits and alternatives for the proposed anesthesia with the patient or authorized representative who has indicated his/her understanding and  acceptance.     Dental advisory given  Plan Discussed with: CRNA and Surgeon  Anesthesia Plan Comments: (Discussed risks of anesthesia with patient, including PONV, sore throat, lip/dental damage. Rare risks discussed as well, such as cardiorespiratory and neurological sequelae. Patient understands.)        Anesthesia Quick Evaluation

## 2022-07-18 NOTE — Transfer of Care (Signed)
Immediate Anesthesia Transfer of Care Note  Patient: Colleen Rogers  Procedure(s) Performed: COLONOSCOPY WITH PROPOFOL  Patient Location: Endoscopy Unit  Anesthesia Type:General  Level of Consciousness: drowsy  Airway & Oxygen Therapy: Patient Spontanous Breathing  Post-op Assessment: Report given to RN and Post -op Vital signs reviewed and stable  Post vital signs: Reviewed and stable  Last Vitals:  Vitals Value Taken Time  BP 133/77 07/18/22 1357  Temp 35.9 1357  Pulse 66 07/18/22 1357  Resp 18 1357  SpO2 100 % 07/18/22 1357    Last Pain:  Vitals:   07/18/22 1247  TempSrc: Temporal  PainSc: 0-No pain         Complications: No notable events documented.

## 2022-07-19 ENCOUNTER — Encounter: Payer: Self-pay | Admitting: Gastroenterology

## 2022-07-20 NOTE — Anesthesia Postprocedure Evaluation (Signed)
Anesthesia Post Note  Patient: Colleen Rogers  Procedure(s) Performed: COLONOSCOPY WITH PROPOFOL  Patient location during evaluation: Endoscopy Anesthesia Type: General Level of consciousness: awake and alert Pain management: pain level controlled Vital Signs Assessment: post-procedure vital signs reviewed and stable Respiratory status: spontaneous breathing, nonlabored ventilation, respiratory function stable and patient connected to nasal cannula oxygen Cardiovascular status: blood pressure returned to baseline and stable Postop Assessment: no apparent nausea or vomiting Anesthetic complications: no   No notable events documented.   Last Vitals:  Vitals:   07/18/22 1418 07/18/22 1428  BP: (!) 146/101 (!) 162/88  Pulse: (!) 50 (!) 49  Resp: 14 13  Temp:    SpO2: 100% 100%    Last Pain:  Vitals:   07/19/22 0745  TempSrc:   PainSc: 0-No pain                 Martha Clan

## 2022-09-19 ENCOUNTER — Other Ambulatory Visit: Payer: Self-pay | Admitting: Pediatrics

## 2022-09-19 DIAGNOSIS — Z1231 Encounter for screening mammogram for malignant neoplasm of breast: Secondary | ICD-10-CM

## 2022-09-22 ENCOUNTER — Ambulatory Visit
Admission: RE | Admit: 2022-09-22 | Discharge: 2022-09-22 | Disposition: A | Discharge status: Home / Self Care | Payer: Medicare Other | Source: Ambulatory Visit | Attending: Pediatrics | Admitting: Pediatrics

## 2022-09-22 DIAGNOSIS — Z1231 Encounter for screening mammogram for malignant neoplasm of breast: Secondary | ICD-10-CM | POA: Diagnosis present

## 2022-11-22 ENCOUNTER — Other Ambulatory Visit: Payer: Self-pay | Admitting: Pediatrics

## 2022-11-22 DIAGNOSIS — Z780 Asymptomatic menopausal state: Secondary | ICD-10-CM

## 2022-12-07 ENCOUNTER — Other Ambulatory Visit: Payer: Medicare Other

## 2022-12-20 ENCOUNTER — Ambulatory Visit
Admission: RE | Admit: 2022-12-20 | Discharge: 2022-12-20 | Disposition: A | Discharge status: Home / Self Care | Payer: Medicare Other | Source: Ambulatory Visit | Attending: Pediatrics | Admitting: Pediatrics

## 2022-12-20 DIAGNOSIS — Z780 Asymptomatic menopausal state: Secondary | ICD-10-CM | POA: Diagnosis present

## 2024-03-04 ENCOUNTER — Other Ambulatory Visit: Payer: Self-pay

## 2024-07-30 ENCOUNTER — Other Ambulatory Visit: Payer: Self-pay | Admitting: Pediatrics

## 2024-07-30 DIAGNOSIS — Z1231 Encounter for screening mammogram for malignant neoplasm of breast: Secondary | ICD-10-CM

## 2024-08-29 ENCOUNTER — Ambulatory Visit
Admission: RE | Admit: 2024-08-29 | Discharge: 2024-08-29 | Disposition: A | Discharge status: Home / Self Care | Source: Ambulatory Visit | Attending: Pediatrics | Admitting: Pediatrics

## 2024-08-29 DIAGNOSIS — Z1231 Encounter for screening mammogram for malignant neoplasm of breast: Secondary | ICD-10-CM | POA: Insufficient documentation
